# Patient Record
Sex: Male | Born: 1950 | Race: Black or African American | Hispanic: No | Marital: Married | Smoking: Never smoker
Health system: Southern US, Community
[De-identification: ages and names within clinical notes are randomized; demographics above are authoritative.]

## PROBLEM LIST (undated history)

## (undated) DIAGNOSIS — C801 Malignant (primary) neoplasm, unspecified: Secondary | ICD-10-CM

## (undated) HISTORY — PX: EYE SURGERY: SHX253

---

## 2018-09-19 ENCOUNTER — Ambulatory Visit: Payer: Self-pay | Admitting: Family Medicine

## 2018-09-21 ENCOUNTER — Encounter (HOSPITAL_COMMUNITY): Payer: Self-pay

## 2018-09-21 ENCOUNTER — Other Ambulatory Visit: Payer: Self-pay

## 2018-09-21 ENCOUNTER — Ambulatory Visit (HOSPITAL_COMMUNITY)
Admission: EM | Admit: 2018-09-21 | Discharge: 2018-09-21 | Disposition: A | Discharge status: Home / Self Care | Payer: Medicare HMO | Attending: Family Medicine | Admitting: Family Medicine

## 2018-09-21 DIAGNOSIS — G43019 Migraine without aura, intractable, without status migrainosus: Secondary | ICD-10-CM

## 2018-09-21 MED ORDER — DEXAMETHASONE SODIUM PHOSPHATE 10 MG/ML IJ SOLN
INTRAMUSCULAR | Status: AC
  Filled 2018-09-21: qty 1

## 2018-09-21 MED ORDER — DEXAMETHASONE SODIUM PHOSPHATE 10 MG/ML IJ SOLN
10.0000 mg | Freq: Once | INTRAMUSCULAR | Status: AC
  Administered 2018-09-21: 10 mg via INTRAMUSCULAR

## 2018-09-21 MED ORDER — IBUPROFEN 800 MG PO TABS: 800.0000 mg | ORAL_TABLET | Freq: Three times a day (TID) | ORAL | 0 refills | Status: DC

## 2018-09-21 MED ORDER — KETOROLAC TROMETHAMINE 60 MG/2ML IM SOLN
INTRAMUSCULAR | Status: AC
  Filled 2018-09-21: qty 2

## 2018-09-21 MED ORDER — KETOROLAC TROMETHAMINE 60 MG/2ML IM SOLN
60.0000 mg | Freq: Once | INTRAMUSCULAR | Status: AC
  Administered 2018-09-21: 60 mg via INTRAMUSCULAR

## 2018-09-21 NOTE — ED Triage Notes (Signed)
Pt present abdominal and Headaches. Symptoms has been going on for a few weeks. Pt has tried  OTC medication with no relief.

## 2018-09-21 NOTE — ED Provider Notes (Signed)
[place]    CSN: [id number] Arrival date & time: 09/21/18  1818     History   Chief Complaint Chief Complaint  Patient presents with  . Abdominal Pain  . Migraine    HPI Steven Jensen is a 68 y.o. male.   HPI  Patient is here for migraine.  He states he is had migraines for most of his life.  He states that he usually takes ibuprofen at home.  On one other occasion he went to the hospital for migraine and got a shot.  He states he currently has had a headache for 4 days.  Ibuprofen is not helping.  He is here with his fiance.  She states he has been lying in the bed for the last 3 to 4 days.  He has photophobia.  Nausea but no vomiting.  No head injury.  No recent infection.  This does feel like a typical migraine for him. At triage she said he is having some abdominal problems.  He denies this to me.  He does want to be seen for abdominal problems.  He is not having any nausea or vomiting.  States his bowels are regular. He just moved here from [place].  I have no old records.  He states he is healthy and on no medications, does not smoke or drink.  He has an appointment with [facility] to set up primary care in the upcoming weeks.  History reviewed. No pertinent past medical history.  There are no active problems to display for this patient.   Past Surgical History:  Procedure Laterality Date  . EYE SURGERY         Home Medications    Prior to Admission medications   Medication Sig Start Date End Date Taking? Authorizing Provider  ibuprofen (ADVIL,MOTRIN) 800 MG tablet Take 1 tablet (800 mg total) by mouth 3 (three) times daily. 09/21/18   Raylene Everts, MD    Family History Family History  Family history unknown: Yes    Social History Social History   Tobacco Use  . Smoking status: Never Smoker  . Smokeless tobacco: Never Used  Substance Use Topics  . Alcohol use: Never    Frequency: Never  . Drug use: Never     Allergies    Patient has no known allergies.   Review of Systems Review of Systems  Constitutional: Negative for chills and fever.  HENT: Negative for ear pain and sore throat.   Eyes: Positive for photophobia. Negative for pain and visual disturbance.  Respiratory: Negative for cough and shortness of breath.   Cardiovascular: Negative for chest pain and palpitations.  Gastrointestinal: Negative for abdominal pain and vomiting.  Genitourinary: Negative for dysuria and hematuria.  Musculoskeletal: Negative for arthralgias and back pain.  Skin: Negative for color change and rash.  Neurological: Positive for headaches. Negative for seizures and syncope.  All other systems reviewed and are negative.    Physical Exam Triage Vital Signs ED Triage Vitals  Enc Vitals Group     BP 09/21/18 1903 139/90     Pulse Rate 09/21/18 1903 91     Resp 09/21/18 1903 16     Temp 09/21/18 1903 97.8 F (36.6 C)     Temp Source 09/21/18 1903 Oral     SpO2 09/21/18 1903 100 %     Weight --      Height --      Head Circumference --      Peak Flow --  Pain Score 09/21/18 1904 7     Pain Loc --      Pain Edu? --      Excl. in [place]? --    No data found.  Updated Vital Signs BP 139/90 (BP Location: Left Arm)   Pulse 91   Temp 97.8 F (36.6 C) (Oral)   Resp 16   SpO2 100%      Physical Exam Constitutional:      General: He is not in acute distress.    Appearance: He is well-developed.  HENT:     Head: Normocephalic and atraumatic.     Mouth/Throat:     Comments: eDentulous upper Eyes:     Conjunctiva/sclera: Conjunctivae normal.     Pupils: Pupils are equal, round, and reactive to light.     Comments: Cataracts  Neck:     Musculoskeletal: Normal range of motion.  Cardiovascular:     Rate and Rhythm: Normal rate and regular rhythm.     Heart sounds: Normal heart sounds.  Pulmonary:     Effort: Pulmonary effort is normal. No respiratory distress.     Breath sounds: Normal breath sounds.   Abdominal:     General: Bowel sounds are normal. There is no distension.     Palpations: Abdomen is soft.     Tenderness: There is no abdominal tenderness.  Musculoskeletal: Normal range of motion.  Skin:    General: Skin is warm and dry.  Neurological:     General: No focal deficit present.     Mental Status: He is alert.  Psychiatric:        Mood and Affect: Mood normal.        Behavior: Behavior normal.      UC Treatments / Results  Labs (all labs ordered are listed, but only abnormal results are displayed) Labs Reviewed - No data to display  EKG None  Radiology No results found.  Procedures Procedures (including critical care time)  Medications Ordered in UC Medications  ketorolac (TORADOL) injection 60 mg (60 mg Intramuscular Given 09/21/18 1937)  dexamethasone (DECADRON) injection 10 mg (10 mg Intramuscular Given 09/21/18 1938)    Initial Impression / Assessment and Plan / UC Course  I have reviewed the triage vital signs and the nursing notes.  Pertinent labs & imaging results that were available during my care of the patient were reviewed by me and considered in my medical decision making (see chart for details).     I believe this is a typical migraine.  I am giving him a shot of Toradol.Return if worse instead of better at any time. Final Clinical Impressions(s) / UC Diagnoses   Final diagnoses:  Intractable migraine without aura and without status migrainosus     Discharge Instructions     Giving you a shot of Toradol for your migraine headache.  Also Decadron. I am giving you a prescription of ibuprofen 800 mg to take 3 times a day with food.  This is for headache You need to follow-up with [facility] to establish with a primary care Return as needed   ED Prescriptions    Medication Sig Dispense Auth. Provider   ibuprofen (ADVIL,MOTRIN) 800 MG tablet Take 1 tablet (800 mg total) by mouth 3 (three) times daily. 21 tablet Raylene Everts, MD     Controlled Substance Prescriptions [place] Controlled Substance Registry consulted? No   Raylene Everts, MD 09/21/18 2055

## 2018-09-21 NOTE — Discharge Instructions (Addendum)
Giving you a shot of Toradol for your migraine headache.  Also Decadron. I am giving you a prescription of ibuprofen 800 mg to take 3 times a day with food.  This is for headache You need to follow-up with [facility] to establish with a primary care Return as needed

## 2018-10-12 ENCOUNTER — Ambulatory Visit: Payer: Self-pay | Admitting: Family Medicine

## 2019-01-18 ENCOUNTER — Ambulatory Visit: Payer: Medicare HMO | Admitting: Family Medicine

## 2019-02-05 ENCOUNTER — Ambulatory Visit: Payer: Medicare HMO | Admitting: Family Medicine

## 2019-03-06 ENCOUNTER — Ambulatory Visit: Payer: Medicare HMO | Admitting: Family Medicine

## 2019-04-03 ENCOUNTER — Other Ambulatory Visit: Payer: Self-pay

## 2019-04-03 ENCOUNTER — Encounter (HOSPITAL_COMMUNITY): Payer: Self-pay | Admitting: Emergency Medicine

## 2019-04-03 ENCOUNTER — Ambulatory Visit (HOSPITAL_COMMUNITY)
Admission: EM | Admit: 2019-04-03 | Discharge: 2019-04-03 | Disposition: A | Discharge status: Home / Self Care | Payer: Medicare HMO | Attending: Family Medicine | Admitting: Family Medicine

## 2019-04-03 DIAGNOSIS — G43009 Migraine without aura, not intractable, without status migrainosus: Secondary | ICD-10-CM

## 2019-04-03 MED ORDER — KETOROLAC TROMETHAMINE 60 MG/2ML IM SOLN
INTRAMUSCULAR | Status: AC
  Filled 2019-04-03: qty 2

## 2019-04-03 MED ORDER — KETOROLAC TROMETHAMINE 60 MG/2ML IM SOLN
60.0000 mg | Freq: Once | INTRAMUSCULAR | Status: AC
  Administered 2019-04-03: 60 mg via INTRAMUSCULAR

## 2019-04-03 MED ORDER — DEXAMETHASONE SODIUM PHOSPHATE 10 MG/ML IJ SOLN
INTRAMUSCULAR | Status: AC
  Filled 2019-04-03: qty 1

## 2019-04-03 MED ORDER — IBUPROFEN 800 MG PO TABS: 800.0000 mg | ORAL_TABLET | Freq: Three times a day (TID) | ORAL | 0 refills | Status: DC

## 2019-04-03 MED ORDER — DEXAMETHASONE SODIUM PHOSPHATE 10 MG/ML IJ SOLN
10.0000 mg | Freq: Once | INTRAMUSCULAR | Status: AC
  Administered 2019-04-03: 10 mg via INTRAMUSCULAR

## 2019-04-03 NOTE — ED Provider Notes (Signed)
[name]    CSN: [id number] Arrival date & time: 04/03/19  1615      History   Chief Complaint Chief Complaint  Patient presents with  . Headache    HPI Steven Jensen is a 68 y.o. male no significant past medical history presenting today for evaluation of headache.  Patient states that this morning around 2 AM he woke up and had a throbbing headache to his right side.  This is waxed and waned throughout the day.  He has taken ibuprofen and Aleve.  Denies associated vision changes.  Denies nausea or vomiting.  Denies photophobia.  Feels similar to previous headaches.  In the past has done well with injections of Toradol and Decadron.  Feels similar to past.  Denies any fevers.  Denies difficulty speaking, lightheadedness, dizziness.  Denies any weakness.  Denies history of hypertension, diabetes.  Is a previous smoker, quit approximately 1 to 2 years ago.  HPI  History reviewed. No pertinent past medical history.  There are no active problems to display for this patient.   Past Surgical History:  Procedure Laterality Date  . EYE SURGERY         Home Medications    Prior to Admission medications   Medication Sig Start Date End Date Taking? Authorizing Provider  ibuprofen (ADVIL) 800 MG tablet Take 1 tablet (800 mg total) by mouth 3 (three) times daily. 04/03/19   Talecia Sherlin, Elesa Hacker, PA-C    Family History Family History  Family history unknown: Yes    Social History Social History   Tobacco Use  . Smoking status: Never Smoker  . Smokeless tobacco: Never Used  Substance Use Topics  . Alcohol use: Never    Frequency: Never  . Drug use: Never     Allergies   Patient has no known allergies.   Review of Systems Review of Systems  Constitutional: Negative for fatigue and fever.  HENT: Negative for congestion, sinus pressure and sore throat.   Eyes: Negative for photophobia, pain and visual disturbance.  Respiratory: Negative for cough and  shortness of breath.   Cardiovascular: Negative for chest pain.  Gastrointestinal: Negative for abdominal pain, nausea and vomiting.  Genitourinary: Negative for decreased urine volume and hematuria.  Musculoskeletal: Negative for myalgias, neck pain and neck stiffness.  Neurological: Positive for headaches. Negative for dizziness, syncope, facial asymmetry, speech difficulty, weakness, light-headedness and numbness.     Physical Exam Triage Vital Signs ED Triage Vitals  Enc Vitals Group     BP --      Pulse Rate 04/03/19 1714 74     Resp 04/03/19 1714 20     Temp 04/03/19 1714 98.6 F (37 C)     Temp Source 04/03/19 1714 Oral     SpO2 04/03/19 1714 100 %     Weight --      Height --      Head Circumference --      Peak Flow --      Pain Score 04/03/19 1712 5     Pain Loc --      Pain Edu? --      Excl. in [place]? --    No data found.  Updated Vital Signs Pulse 74   Temp 98.6 F (37 C) (Oral)   Resp 20   SpO2 100%   Visual Acuity Right Eye Distance:   Left Eye Distance:   Bilateral Distance:    Right Eye Near:   Left Eye Near:  Bilateral Near:     Physical Exam Vitals signs and nursing note reviewed.  Constitutional:      Appearance: He is well-developed.  HENT:     Head: Normocephalic and atraumatic.     Ears:     Comments: Bilateral ears without tenderness to palpation of external auricle, tragus and mastoid, EAC's without erythema or swelling, TM's with good bony landmarks and cone of light. Non erythematous.     Mouth/Throat:     Comments: Oral mucosa pink and moist, no tonsillar enlargement or exudate. Posterior pharynx patent and nonerythematous, no uvula deviation or swelling. Normal phonation. Palate elevates symmetrically Eyes:     Extraocular Movements: Extraocular movements intact.     Conjunctiva/sclera: Conjunctivae normal.     Pupils: Pupils are equal, round, and reactive to light.  Neck:     Musculoskeletal: Neck supple.  Cardiovascular:      Rate and Rhythm: Normal rate and regular rhythm.     Heart sounds: No murmur.  Pulmonary:     Effort: Pulmonary effort is normal. No respiratory distress.     Breath sounds: Normal breath sounds.     Comments: Breathing comfortably at rest, CTABL, no wheezing, rales or other adventitious sounds auscultated Abdominal:     Palpations: Abdomen is soft.     Tenderness: There is no abdominal tenderness.  Skin:    General: Skin is warm and dry.  Neurological:     General: No focal deficit present.     Mental Status: He is alert and oriented to person, place, and time. Mental status is at baseline.     Comments: Patient A&O x3, cranial nerves II-XII grossly intact, strength at shoulders, hips and knees 5/5, equal bilaterally, patellar reflex 2+ bilaterally. Negative Romberg. Gait without abnormality.       UC Treatments / Results  Labs (all labs ordered are listed, but only abnormal results are displayed) Labs Reviewed - No data to display  EKG   Radiology No results found.  Procedures Procedures (including critical care time)  Medications Ordered in UC Medications  ketorolac (TORADOL) injection 60 mg (60 mg Intramuscular Given 04/03/19 1804)  dexamethasone (DECADRON) injection 10 mg (10 mg Intramuscular Given 04/03/19 1803)  dexamethasone (DECADRON) 10 MG/ML injection (has no administration in time range)  ketorolac (TORADOL) 60 MG/2ML injection (has no administration in time range)    Initial Impression / Assessment and Plan / UC Course  I have reviewed the triage vital signs and the nursing notes.  Pertinent labs & imaging results that were available during my care of the patient were reviewed by me and considered in my medical decision making (see chart for details).     No neuro deficits on exam, no acute distress.  Do not suspect underlying acute intracranial abnormality at this time that would warrant CT imaging.  Will provide Toradol and Decadron.  Will refill  ibuprofen 800 as he has had relief with this as well.  Discussed warning signs,Discussed strict return precautions. Patient verbalized understanding and is agreeable with plan.  Final Clinical Impressions(s) / UC Diagnoses   Final diagnoses:  Migraine without aura and without status migrainosus, not intractable     Discharge Instructions     We gave you Toradol and Decadron today for your headache Continue with ibuprofen at home for further headache management  Please follow-up in the emergency room if developing worsening headache, vision changes, difficulty speaking, confusion, lightheadedness, dizziness, facial drooping, one-sided weakness   ED Prescriptions  Medication Sig Dispense Auth. Provider   ibuprofen (ADVIL) 800 MG tablet Take 1 tablet (800 mg total) by mouth 3 (three) times daily. 30 tablet Giovoni Bunch, [place] C, PA-C     PDMP not reviewed this encounter.   Janith Lima, [place] 04/03/19 1827

## 2019-04-03 NOTE — ED Triage Notes (Signed)
Patient presents to [facility] for assessment of headache starting this morning, has tried aleve and ibuprofen and had relief for a brief period, but headache is back.  Denies vision changed, denies numbness or weakness.

## 2019-04-03 NOTE — Discharge Instructions (Signed)
We gave you Toradol and Decadron today for your headache Continue with ibuprofen at home for further headache management  Please follow-up in the emergency room if developing worsening headache, vision changes, difficulty speaking, confusion, lightheadedness, dizziness, facial drooping, one-sided weakness

## 2019-05-14 ENCOUNTER — Ambulatory Visit: Payer: Medicare HMO | Admitting: Family Medicine

## 2019-07-06 ENCOUNTER — Encounter (HOSPITAL_COMMUNITY): Payer: Self-pay

## 2019-07-06 ENCOUNTER — Ambulatory Visit (HOSPITAL_COMMUNITY)
Admission: EM | Admit: 2019-07-06 | Discharge: 2019-07-06 | Disposition: A | Discharge status: Home / Self Care | Payer: Medicare HMO | Attending: Family Medicine | Admitting: Family Medicine

## 2019-07-06 ENCOUNTER — Other Ambulatory Visit: Payer: Self-pay

## 2019-07-06 DIAGNOSIS — M5432 Sciatica, left side: Secondary | ICD-10-CM | POA: Diagnosis not present

## 2019-07-06 MED ORDER — KETOROLAC TROMETHAMINE 30 MG/ML IJ SOLN
15.0000 mg | Freq: Once | INTRAMUSCULAR | Status: AC
  Administered 2019-07-06: 13:00:00 15 mg via INTRAMUSCULAR

## 2019-07-06 MED ORDER — KETOROLAC TROMETHAMINE 30 MG/ML IJ SOLN
INTRAMUSCULAR | Status: AC
  Filled 2019-07-06: qty 1

## 2019-07-06 MED ORDER — PREDNISONE 10 MG (21) PO TBPK: ORAL_TABLET | ORAL | 0 refills | Status: DC

## 2019-07-06 NOTE — Discharge Instructions (Addendum)
Take the medication as prescribed with food Follow up as needed for continued or worsening symptoms

## 2019-07-06 NOTE — ED Triage Notes (Signed)
_Pt presents with recurrent left side sciatic nerve pain that starts at lower back and radiates down left leg since yesterday.

## 2019-07-07 NOTE — ED Provider Notes (Signed)
[place]    CSN: [id number] Arrival date & time: 07/06/19  1053      History   Chief Complaint Chief Complaint  Patient presents with  . Sciatic Pain    HPI Steven Jensen is a 68 y.o. male.   Patient is a 68 year old male presents today with recurrent left-sided sciatic nerve pain.  This episode started yesterday.  Reporting pain starts left lower back radiating down to the left leg with some generalized tingling.  Worse with laying flat.  Symptoms have been waxing waning.  He took ibuprofen without much relief.  Denies any injuries or heavy lifting.  Denies any loss of bowel or bladder control or saddle paresthesias.  No fevers, urinary symptoms or constipation.  ROS per HPI      History reviewed. No pertinent past medical history.  There are no problems to display for this patient.   Past Surgical History:  Procedure Laterality Date  . EYE SURGERY         Home Medications    Prior to Admission medications   Medication Sig Start Date End Date Taking? Authorizing Provider  ibuprofen (ADVIL) 800 MG tablet Take 1 tablet (800 mg total) by mouth 3 (three) times daily. 04/03/19   Wieters, Hallie C, PA-C  predniSONE (STERAPRED UNI-PAK 21 TAB) 10 MG (21) TBPK tablet 6 tabs for 1 day, then 5 tabs for 1 das, then 4 tabs for 1 day, then 3 tabs for 1 day, 2 tabs for 1 day, then 1 tab for 1 day 07/06/19   Orvan July, NP    Family History Family History  Family history unknown: Yes    Social History Social History   Tobacco Use  . Smoking status: Never Smoker  . Smokeless tobacco: Never Used  Substance Use Topics  . Alcohol use: Never  . Drug use: Never     Allergies   Patient has no known allergies.   Review of Systems Review of Systems   Physical Exam Triage Vital Signs ED Triage Vitals  Enc Vitals Group     BP 07/06/19 1209 (!) 142/97     Pulse Rate 07/06/19 1209 68     Resp 07/06/19 1209 17     Temp 07/06/19 1209 98.3 F (36.8  C)     Temp Source 07/06/19 1209 Oral     SpO2 07/06/19 1209 98 %     Weight --      Height --      Head Circumference --      Peak Flow --      Pain Score 07/06/19 1210 10     Pain Loc --      Pain Edu? --      Excl. in [place]? --    No data found.  Updated Vital Signs BP (!) 142/97 (BP Location: Right Arm)   Pulse 68   Temp 98.3 F (36.8 C) (Oral)   Resp 17   SpO2 98%   Visual Acuity Right Eye Distance:   Left Eye Distance:   Bilateral Distance:    Right Eye Near:   Left Eye Near:    Bilateral Near:     Physical Exam Vitals and nursing note reviewed.  Constitutional:      Appearance: Normal appearance.  HENT:     Head: Normocephalic and atraumatic.     Nose: Nose normal.  Eyes:     Conjunctiva/sclera: Conjunctivae normal.  Pulmonary:     Effort: Pulmonary effort is normal.  Musculoskeletal:        General: Normal range of motion.     Cervical back: Normal range of motion.     Lumbar back: Tenderness present.       Back:  Skin:    General: Skin is warm and dry.  Neurological:     Mental Status: He is alert.  Psychiatric:        Mood and Affect: Mood normal.      UC Treatments / Results  Labs (all labs ordered are listed, but only abnormal results are displayed) Labs Reviewed - No data to display  EKG   Radiology No results found.  Procedures Procedures (including critical care time)  Medications Ordered in UC Medications  ketorolac (TORADOL) 30 MG/ML injection 15 mg (15 mg Intramuscular Given 07/06/19 1234)    Initial Impression / Assessment and Plan / UC Course  I have reviewed the triage vital signs and the nursing notes.  Pertinent labs & imaging results that were available during my care of the patient were reviewed by me and considered in my medical decision making (see chart for details).     Left-sided nerve pain-treating with small dose of Toradol injection here in clinic and prednisone taper over the next 6 days. Follow up as  needed for continued or worsening symptoms  Final Clinical Impressions(s) / UC Diagnoses   Final diagnoses:  Left sciatic nerve pain     Discharge Instructions     Take the medication as prescribed with food Follow up as needed for continued or worsening symptoms     ED Prescriptions    Medication Sig Dispense Auth. Provider   predniSONE (STERAPRED UNI-PAK 21 TAB) 10 MG (21) TBPK tablet 6 tabs for 1 day, then 5 tabs for 1 das, then 4 tabs for 1 day, then 3 tabs for 1 day, 2 tabs for 1 day, then 1 tab for 1 day 21 tablet Jan Walters A, NP     PDMP not reviewed this encounter.   Orvan July, NP 07/07/19 1448

## 2019-07-08 ENCOUNTER — Ambulatory Visit (HOSPITAL_COMMUNITY)
Admission: EM | Admit: 2019-07-08 | Discharge: 2019-07-08 | Disposition: A | Discharge status: Home / Self Care | Payer: Medicare HMO | Attending: Family Medicine | Admitting: Family Medicine

## 2019-07-08 ENCOUNTER — Other Ambulatory Visit: Payer: Self-pay

## 2019-07-08 ENCOUNTER — Ambulatory Visit (INDEPENDENT_AMBULATORY_CARE_PROVIDER_SITE_OTHER): Payer: Medicare HMO

## 2019-07-08 ENCOUNTER — Encounter (HOSPITAL_COMMUNITY): Payer: Self-pay

## 2019-07-08 DIAGNOSIS — M5136 Other intervertebral disc degeneration, lumbar region: Secondary | ICD-10-CM

## 2019-07-08 DIAGNOSIS — M545 Low back pain: Secondary | ICD-10-CM | POA: Diagnosis not present

## 2019-07-08 MED ORDER — TRAMADOL HCL 50 MG PO TABS: 50.0000 mg | ORAL_TABLET | Freq: Two times a day (BID) | ORAL | 0 refills | Status: DC | PRN

## 2019-07-08 NOTE — ED Triage Notes (Signed)
Pt states he has sciatic pain. Pt states he was here for this issue on Saturday. Pt states he needs a work note. Pt states the meds are not working.

## 2019-07-08 NOTE — Discharge Instructions (Signed)
Your x-ray did show some degenerative disc disease.  This is most likely the cause of your symptoms Patient to keep taking the prednisone until the prescription is finished I will give you some tramadol to help with severe pain and a work note. If your symptoms continue you will need to follow-up with an orthopedic.

## 2019-07-09 NOTE — ED Provider Notes (Signed)
[place]    CSN: [id number] Arrival date & time: 07/08/19  [id number]      History   Chief Complaint Chief Complaint  Patient presents with  . Back Pain    HPI Steven Jensen is a 68 y.o. male.   Patient is a 68 year old male the presents today for continued left sciatic nerve pain.  He has been taking the prednisone as prescribed without any relief.  Reports he has been resting.  Denies any worsening symptoms to include saddle paresthesias, loss of bowel or bladder control.  Denies any fevers.  ROS per HPI    Back Pain   History reviewed. No pertinent past medical history.  There are no problems to display for this patient.   Past Surgical History:  Procedure Laterality Date  . EYE SURGERY         Home Medications    Prior to Admission medications   Medication Sig Start Date End Date Taking? Authorizing Provider  ibuprofen (ADVIL) 800 MG tablet Take 1 tablet (800 mg total) by mouth 3 (three) times daily. 04/03/19   Wieters, Hallie C, PA-C  predniSONE (STERAPRED UNI-PAK 21 TAB) 10 MG (21) TBPK tablet 6 tabs for 1 day, then 5 tabs for 1 das, then 4 tabs for 1 day, then 3 tabs for 1 day, 2 tabs for 1 day, then 1 tab for 1 day 07/06/19   Loura Halt A, NP  traMADol (ULTRAM) 50 MG tablet Take 1 tablet (50 mg total) by mouth every 12 (twelve) hours as needed. 07/08/19   Orvan July, NP    Family History Family History  Family history unknown: Yes    Social History Social History   Tobacco Use  . Smoking status: Never Smoker  . Smokeless tobacco: Never Used  Substance Use Topics  . Alcohol use: Never  . Drug use: Never     Allergies   Patient has no known allergies.   Review of Systems Review of Systems  Musculoskeletal: Positive for back pain.     Physical Exam Triage Vital Signs ED Triage Vitals  Enc Vitals Group     BP 07/08/19 1132 (!) 163/84     Pulse Rate 07/08/19 1132 82     Resp 07/08/19 1132 16     Temp 07/08/19 1132  98.5 F (36.9 C)     Temp Source 07/08/19 1132 Oral     SpO2 07/08/19 1132 100 %     Weight 07/08/19 1130 149 lb (67.6 kg)     Height --      Head Circumference --      Peak Flow --      Pain Score 07/08/19 1321 2     Pain Loc --      Pain Edu? --      Excl. in [place]? --    No data found.  Updated Vital Signs BP (!) 163/84 (BP Location: Right Arm)   Pulse 82   Temp 98.5 F (36.9 C) (Oral)   Resp 16   Wt 149 lb (67.6 kg)   SpO2 100%   Visual Acuity Right Eye Distance:   Left Eye Distance:   Bilateral Distance:    Right Eye Near:   Left Eye Near:    Bilateral Near:     Physical Exam Vitals and nursing note reviewed.  Constitutional:      Appearance: Normal appearance.  HENT:     Head: Normocephalic and atraumatic.     Nose: Nose normal.  Eyes:     Conjunctiva/sclera: Conjunctivae normal.  Pulmonary:     Effort: Pulmonary effort is normal.  Musculoskeletal:     Cervical back: Normal range of motion.     Lumbar back: Decreased range of motion. Positive left straight leg raise test.       Back:  Skin:    General: Skin is warm and dry.  Neurological:     Mental Status: He is alert.  Psychiatric:        Mood and Affect: Mood normal.      UC Treatments / Results  Labs (all labs ordered are listed, but only abnormal results are displayed) Labs Reviewed - No data to display  EKG   Radiology DG Lumbar Spine Complete  Result Date: 07/08/2019 CLINICAL DATA:  Low back pain radiating into the left leg, initial encounter EXAM: LUMBAR SPINE - COMPLETE 4+ VIEW COMPARISON:  None. FINDINGS: Five lumbar type vertebral bodies are well visualized. Vertebral body height is well maintained. No pars defects are seen. Mild facet hypertrophic changes are noted. Disc space narrowing at L4-5 and L5-S1 is noted. Mild degenerative anterolisthesis of L4 on L5 is seen. No soft tissue abnormality is noted. IMPRESSION: Degenerative changes as described. Electronically Signed   By:  Inez Catalina M.D.   On: 07/08/2019 13:09    Procedures Procedures (including critical care time)  Medications Ordered in UC Medications - No data to display  Initial Impression / Assessment and Plan / UC Course  I have reviewed the triage vital signs and the nursing notes.  Pertinent labs & imaging results that were available during my care of the patient were reviewed by me and considered in my medical decision making (see chart for details).     Degenerative disc disease lumbar-based on patient's persistent symptoms despite prednisone taper, decided to do lower lumbar x-ray which revealed degenerative disc disease at L4-5 and L5-S1.  This did explain the cause for his symptoms. We will have him continue the prednisone taper and give tramadol for more severe pain Recommended if symptoms continue or worsen he will need to see orthopedic Patient understanding and agree. Final Clinical Impressions(s) / UC Diagnoses   Final diagnoses:  Degenerative disc disease, lumbar     Discharge Instructions     Your x-ray did show some degenerative disc disease.  This is most likely the cause of your symptoms Patient to keep taking the prednisone until the prescription is finished I will give you some tramadol to help with severe pain and a work note. If your symptoms continue you will need to follow-up with an orthopedic.    ED Prescriptions    Medication Sig Dispense Auth. Provider   traMADol (ULTRAM) 50 MG tablet Take 1 tablet (50 mg total) by mouth every 12 (twelve) hours as needed. 15 tablet Keelen Quevedo A, NP     I have reviewed the PDMP during this encounter.   Orvan July, NP 07/09/19 1629

## 2019-07-15 ENCOUNTER — Ambulatory Visit: Payer: Medicare HMO | Admitting: Family Medicine

## 2019-07-17 ENCOUNTER — Ambulatory Visit: Payer: Medicare HMO | Admitting: Family Medicine

## 2019-07-23 DIAGNOSIS — M5416 Radiculopathy, lumbar region: Secondary | ICD-10-CM | POA: Diagnosis not present

## 2019-08-07 ENCOUNTER — Other Ambulatory Visit: Payer: Self-pay | Admitting: Sports Medicine

## 2019-08-07 DIAGNOSIS — M545 Low back pain, unspecified: Secondary | ICD-10-CM

## 2019-08-09 ENCOUNTER — Other Ambulatory Visit: Payer: Medicare HMO

## 2019-08-09 DIAGNOSIS — M5416 Radiculopathy, lumbar region: Secondary | ICD-10-CM | POA: Diagnosis not present

## 2019-08-14 DIAGNOSIS — S335XXA Sprain of ligaments of lumbar spine, initial encounter: Secondary | ICD-10-CM | POA: Diagnosis not present

## 2019-08-17 DIAGNOSIS — M545 Low back pain: Secondary | ICD-10-CM | POA: Diagnosis not present

## 2019-08-20 DIAGNOSIS — M545 Low back pain: Secondary | ICD-10-CM | POA: Diagnosis not present

## 2019-09-09 ENCOUNTER — Ambulatory Visit: Payer: Medicare HMO | Admitting: Family Medicine

## 2019-09-09 DIAGNOSIS — Z0289 Encounter for other administrative examinations: Secondary | ICD-10-CM

## 2019-10-08 DIAGNOSIS — Z23 Encounter for immunization: Secondary | ICD-10-CM | POA: Diagnosis not present

## 2019-11-05 DIAGNOSIS — Z23 Encounter for immunization: Secondary | ICD-10-CM | POA: Diagnosis not present

## 2020-02-10 DIAGNOSIS — H2513 Age-related nuclear cataract, bilateral: Secondary | ICD-10-CM | POA: Diagnosis not present

## 2020-02-10 DIAGNOSIS — H109 Unspecified conjunctivitis: Secondary | ICD-10-CM | POA: Diagnosis not present

## 2020-04-06 DIAGNOSIS — H2513 Age-related nuclear cataract, bilateral: Secondary | ICD-10-CM | POA: Diagnosis not present

## 2020-04-06 DIAGNOSIS — H33002 Unspecified retinal detachment with retinal break, left eye: Secondary | ICD-10-CM | POA: Diagnosis not present

## 2020-04-10 DEATH — deceased

## 2020-04-12 DIAGNOSIS — M546 Pain in thoracic spine: Secondary | ICD-10-CM | POA: Diagnosis not present

## 2020-04-12 DIAGNOSIS — Z9842 Cataract extraction status, left eye: Secondary | ICD-10-CM | POA: Diagnosis not present

## 2020-04-12 DIAGNOSIS — M62830 Muscle spasm of back: Secondary | ICD-10-CM | POA: Diagnosis not present

## 2020-07-21 DIAGNOSIS — H2513 Age-related nuclear cataract, bilateral: Secondary | ICD-10-CM | POA: Diagnosis not present

## 2020-07-21 DIAGNOSIS — Z0100 Encounter for examination of eyes and vision without abnormal findings: Secondary | ICD-10-CM | POA: Diagnosis not present

## 2021-08-13 ENCOUNTER — Encounter: Payer: Self-pay | Admitting: Family Medicine

## 2021-08-13 ENCOUNTER — Ambulatory Visit (INDEPENDENT_AMBULATORY_CARE_PROVIDER_SITE_OTHER): Payer: Medicare HMO | Admitting: Family Medicine

## 2021-08-13 ENCOUNTER — Other Ambulatory Visit: Payer: Self-pay

## 2021-08-13 VITALS — BP 120/74 | HR 87 | Temp 97.3°F | Ht 68.0 in | Wt 142.8 lb

## 2021-08-13 DIAGNOSIS — Z1211 Encounter for screening for malignant neoplasm of colon: Secondary | ICD-10-CM | POA: Diagnosis not present

## 2021-08-13 DIAGNOSIS — R69 Illness, unspecified: Secondary | ICD-10-CM | POA: Diagnosis not present

## 2021-08-13 DIAGNOSIS — R972 Elevated prostate specific antigen [PSA]: Secondary | ICD-10-CM

## 2021-08-13 DIAGNOSIS — K2960 Other gastritis without bleeding: Secondary | ICD-10-CM | POA: Diagnosis not present

## 2021-08-13 DIAGNOSIS — G2581 Restless legs syndrome: Secondary | ICD-10-CM | POA: Diagnosis not present

## 2021-08-13 DIAGNOSIS — R350 Frequency of micturition: Secondary | ICD-10-CM | POA: Diagnosis not present

## 2021-08-13 DIAGNOSIS — T39395A Adverse effect of other nonsteroidal anti-inflammatory drugs [NSAID], initial encounter: Secondary | ICD-10-CM

## 2021-08-13 DIAGNOSIS — Z1159 Encounter for screening for other viral diseases: Secondary | ICD-10-CM | POA: Diagnosis not present

## 2021-08-13 DIAGNOSIS — E782 Mixed hyperlipidemia: Secondary | ICD-10-CM | POA: Diagnosis not present

## 2021-08-13 DIAGNOSIS — G4730 Sleep apnea, unspecified: Secondary | ICD-10-CM

## 2021-08-13 DIAGNOSIS — Z114 Encounter for screening for human immunodeficiency virus [HIV]: Secondary | ICD-10-CM

## 2021-08-13 DIAGNOSIS — R7303 Prediabetes: Secondary | ICD-10-CM | POA: Diagnosis not present

## 2021-08-13 DIAGNOSIS — Z0000 Encounter for general adult medical examination without abnormal findings: Secondary | ICD-10-CM

## 2021-08-13 DIAGNOSIS — N401 Enlarged prostate with lower urinary tract symptoms: Secondary | ICD-10-CM

## 2021-08-13 MED ORDER — TAMSULOSIN HCL 0.4 MG PO CAPS: 0.4000 mg | ORAL_CAPSULE | Freq: Every day | ORAL | 3 refills | Status: DC

## 2021-08-13 MED ORDER — PANTOPRAZOLE SODIUM 40 MG PO TBEC: 40.0000 mg | DELAYED_RELEASE_TABLET | Freq: Every day | ORAL | 3 refills | Status: DC

## 2021-08-13 NOTE — Progress Notes (Addendum)
Established Patient Office Visit  Subjective:  Patient ID: Steven Jensen, male    DOB: [birth year]  Age: 71 y.o. MRN: [id number]  CC:  Chief Complaint  Patient presents with   Establish Care    NP/establish care, no concerns. Patient not fasting.     HPI [name] presents for establishment of care he is accompanied by his wife.  They have recently moved into the area.  He has been lost to follow-up from regular medical care for quite some time.  He is healthy as far as he knows.  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  He has been taking a lot of ibuprofen and Aleve for body aches and pains.  He does admit to some increased belching and flatulence.  He does not appreciate any reflux or brackish taste in the morning.  He does have morning nausea and vomiting on occasion.  He has not seen any blood in his stool or melena.  He has never had a colonoscopy.  Over the last month or so his urine frequency has increased a great deal.  He is getting up every few hours to urinate.  MRI from a year or 2 ago showed a suspicious alleviating lesion somewhere in his body.  He thinks it may have been the prostate.  Wife says that he snores loudly and stops breathing.  She also says that he kicks her at night in his sleep.  He has lost 2 to 3 pounds because he wanted to.  Denies night sweats.  He feels healthy for the most part.  Feels as though he is 8 on a scale of 10.  History reviewed. No pertinent past medical history.  Past Surgical History:  Procedure Laterality Date   EYE SURGERY      Family History  Family history unknown: Yes    Social History   Socioeconomic History   Marital status: Legally Separated    Spouse name: Not on file   Number of children: Not on file   Years of education: Not on file   Highest education level: Not on file  Occupational History   Not on file  Tobacco Use   Smoking status:  Never   Smokeless tobacco: Never  Vaping Use   Vaping Use: Never used  Substance and Sexual Activity   Alcohol use: Never   Drug use: Never   Sexual activity: Yes  Other Topics Concern   Not on file  Social History Narrative   Not on file   Social Determinants of Health   Financial Resource Strain: Not on file  Food Insecurity: Not on file  Transportation Needs: Not on file  Physical Activity: Not on file  Stress: Not on file  Social Connections: Not on file  Intimate Partner Violence: Not on file    Outpatient Medications Prior to Visit  Medication Sig Dispense Refill   ibuprofen (ADVIL) 800 MG tablet Take 1 tablet (800 mg total) by mouth 3 (three) times daily. 30 tablet 0   predniSONE (STERAPRED UNI-PAK 21 TAB) 10 MG (21) TBPK tablet 6 tabs for 1 day, then 5 tabs for 1 das, then 4 tabs for 1 day, then 3 tabs for 1 day, 2 tabs for 1 day, then 1 tab for 1 day 21 tablet 0   traMADol (ULTRAM) 50 MG tablet Take 1 tablet (50 mg total) by mouth every 12 (twelve) hours as  needed. 15 tablet 0   No facility-administered medications prior to visit.    No Known Allergies  ROS Review of Systems  Constitutional:  Negative for chills, diaphoresis, fatigue, fever and unexpected weight change.  HENT: Negative.    Eyes:  Negative for photophobia and visual disturbance.  Respiratory:  Negative for apnea, shortness of breath and wheezing.   Cardiovascular:  Negative for chest pain.  Gastrointestinal:  Positive for vomiting. Negative for abdominal pain, anal bleeding and blood in stool.  Endocrine: Negative for polyphagia and polyuria.  Genitourinary:  Positive for difficulty urinating, frequency and urgency. Negative for hematuria.  Musculoskeletal:  Positive for arthralgias.  Neurological:  Negative for speech difficulty and weakness.  Psychiatric/Behavioral: Negative.       Objective:    Physical Exam Vitals and nursing note reviewed.  Constitutional:      General: He is not in  acute distress.    Appearance: Normal appearance. He is normal weight. He is not ill-appearing, toxic-appearing or diaphoretic.  HENT:     Head: Normocephalic and atraumatic.     Right Ear: Tympanic membrane, ear canal and external ear normal.     Left Ear: Tympanic membrane, ear canal and external ear normal.     Mouth/Throat:     Mouth: Mucous membranes are moist.     Pharynx: Oropharynx is clear. No oropharyngeal exudate or posterior oropharyngeal erythema.   Eyes:     General:        Right eye: No discharge.        Left eye: No discharge.     Extraocular Movements: Extraocular movements intact.     Conjunctiva/sclera: Conjunctivae normal.     Pupils: Pupils are equal, round, and reactive to light.  Cardiovascular:     Rate and Rhythm: Normal rate and regular rhythm.  Pulmonary:     Effort: Pulmonary effort is normal.     Breath sounds: Normal breath sounds.  Abdominal:     General: Abdomen is flat. Bowel sounds are normal. There is no distension.     Palpations: Abdomen is soft. There is no mass.     Tenderness: There is no abdominal tenderness. There is no guarding or rebound.     Hernia: No hernia is present.  Genitourinary:    Prostate: Enlarged and nodules present. Not tender.     Rectum: Guaiac result negative. No mass, tenderness, anal fissure, external hemorrhoid or internal hemorrhoid. Normal anal tone.    Musculoskeletal:     Cervical back: No rigidity or tenderness.  Lymphadenopathy:     Cervical: No cervical adenopathy.  Skin:    General: Skin is warm and dry.  Neurological:     Mental Status: He is alert and oriented to person, place, and time.  Psychiatric:        Mood and Affect: Mood normal.        Behavior: Behavior normal.        Thought Content: Thought content normal.    BP 120/74 (BP Location: Right Arm, Patient Position: Sitting, Cuff Size: Normal)    Pulse 87    Temp (!) 97.3 F (36.3 C) (Temporal)    Ht 5\' 8"  (1.727 m) Comment: 5' 8  1/4   Wt  142 lb 12.8 oz (64.8 kg)    SpO2 97%    BMI 21.71 kg/m  Wt Readings from Last 3 Encounters:  08/13/21 142 lb 12.8 oz (64.8 kg)  07/08/19 149 lb (67.6 kg)     Health Maintenance  Due  Topic Date Due   Hepatitis C Screening  Never done   TETANUS/TDAP  Never done   COLONOSCOPY (Pts 45-53yrs Insurance coverage will need to be confirmed)  Never done   Zoster Vaccines- Shingrix (1 of 2) Never done   Pneumonia Vaccine 67+ Years old (1 - PCV) Never done   INFLUENZA VACCINE  Never done    There are no preventive care reminders to display for this patient.  No results found for: TSH Lab Results  Component Value Date   WBC 5.9 08/13/2021   HGB 11.1 (L) 08/13/2021   HCT 35.1 (L) 08/13/2021   MCV 71.3 (L) 08/13/2021   PLT 506 (H) 08/13/2021   Lab Results  Component Value Date   NA 137 08/13/2021   K 4.6 08/13/2021   CO2 25 08/13/2021   GLUCOSE 96 08/13/2021   BUN 12 08/13/2021   CREATININE 0.76 08/13/2021   BILITOT 0.4 08/13/2021   AST 18 08/13/2021   ALT 10 08/13/2021   PROT 6.9 08/13/2021   CALCIUM 9.7 08/13/2021   No results found for: CHOL No results found for: HDL No results found for: LDLCALC No results found for: TRIG No results found for: CHOLHDL Lab Results  Component Value Date   HGBA1C 6.1 (H) 08/13/2021      Assessment & Plan:   Problem List Items Addressed This Visit       Respiratory   Sleep apnea   Relevant Orders   Ambulatory referral to Sleep Studies     Digestive   NSAID induced gastritis   Relevant Medications   pantoprazole (PROTONIX) 40 MG tablet   Other Relevant Orders   Ambulatory referral to Gastroenterology     Other   Elevated PSA   Relevant Orders   Ambulatory referral to Urology   RLS (restless legs syndrome)   Relevant Orders   Ambulatory referral to Sleep Studies   Healthcare maintenance - Primary   Relevant Orders   CBC (Completed)   Comprehensive metabolic panel (Completed)   Hemoglobin A1c (Completed)   Urinalysis,  Routine w reflex microscopic (Completed)   LDL cholesterol, direct (Completed)   Benign prostatic hyperplasia with urinary frequency   Relevant Medications   tamsulosin (FLOMAX) 0.4 MG CAPS capsule   Other Relevant Orders   PSA (Completed)   Ambulatory referral to Urology    Meds ordered this encounter  Medications   tamsulosin (FLOMAX) 0.4 MG CAPS capsule    Sig: Take 1 capsule (0.4 mg total) by mouth daily.    Dispense:  30 capsule    Refill:  3   pantoprazole (PROTONIX) 40 MG tablet    Sig: Take 1 tablet (40 mg total) by mouth daily.    Dispense:  30 tablet    Refill:  3    Follow-up: Return in about 4 weeks (around 09/10/2021).  .  Aleve and ibuprofen and just use Tylenol.  We will start Protonix daily.  We will start tamsulosin daily.  Referred to GI for his first colonoscopy with an EGD.  Sleep study for work-up of apnea and RLS.  Probable refer to neurology.  Suspect prostate cancer.  Information was given on health maintenance and disease invention for those over 16.  Information was also given about BPH and gastritis.  Libby Maw, MD

## 2021-08-16 DIAGNOSIS — Z1159 Encounter for screening for other viral diseases: Secondary | ICD-10-CM | POA: Insufficient documentation

## 2021-08-16 DIAGNOSIS — T39395A Adverse effect of other nonsteroidal anti-inflammatory drugs [NSAID], initial encounter: Secondary | ICD-10-CM | POA: Insufficient documentation

## 2021-08-16 DIAGNOSIS — R972 Elevated prostate specific antigen [PSA]: Secondary | ICD-10-CM | POA: Insufficient documentation

## 2021-08-16 DIAGNOSIS — G2581 Restless legs syndrome: Secondary | ICD-10-CM | POA: Insufficient documentation

## 2021-08-16 DIAGNOSIS — K2960 Other gastritis without bleeding: Secondary | ICD-10-CM | POA: Insufficient documentation

## 2021-08-16 DIAGNOSIS — N401 Enlarged prostate with lower urinary tract symptoms: Secondary | ICD-10-CM | POA: Insufficient documentation

## 2021-08-16 DIAGNOSIS — R0681 Apnea, not elsewhere classified: Secondary | ICD-10-CM | POA: Insufficient documentation

## 2021-08-16 DIAGNOSIS — Z0000 Encounter for general adult medical examination without abnormal findings: Secondary | ICD-10-CM | POA: Insufficient documentation

## 2021-08-16 DIAGNOSIS — R350 Frequency of micturition: Secondary | ICD-10-CM | POA: Insufficient documentation

## 2021-08-16 DIAGNOSIS — G4730 Sleep apnea, unspecified: Secondary | ICD-10-CM | POA: Insufficient documentation

## 2021-08-16 LAB — HEMOGLOBIN A1C
Hgb A1c MFr Bld: 6.1 % of total Hgb — ABNORMAL HIGH (ref <5.7)
Mean Plasma Glucose: 128 mg/dL
eAG (mmol/L): 7.1 mmol/L

## 2021-08-16 LAB — COMPREHENSIVE METABOLIC PANEL
AG Ratio: 1.5 (calc) (ref 1.0–2.5)
ALT: 10 U/L (ref 9–46)
AST: 18 U/L (ref 10–35)
Albumin: 4.1 g/dL (ref 3.6–5.1)
Alkaline phosphatase (APISO): 119 U/L (ref 35–144)
BUN: 12 mg/dL (ref 7–25)
CO2: 25 mmol/L (ref 20–32)
Calcium: 9.7 mg/dL (ref 8.6–10.3)
Chloride: 102 mmol/L (ref 98–110)
Creat: 0.76 mg/dL (ref 0.70–1.28)
Globulin: 2.8 g/dL (calc) (ref 1.9–3.7)
Glucose, Bld: 96 mg/dL (ref 65–99)
Potassium: 4.6 mmol/L (ref 3.5–5.3)
Sodium: 137 mmol/L (ref 135–146)
Total Bilirubin: 0.4 mg/dL (ref 0.2–1.2)
Total Protein: 6.9 g/dL (ref 6.1–8.1)

## 2021-08-16 LAB — HEPATITIS C ANTIBODY
Hepatitis C Ab: NONREACTIVE
SIGNAL TO CUT-OFF: 0.03 (ref <1.00)

## 2021-08-16 LAB — HIV ANTIBODY (ROUTINE TESTING W REFLEX): HIV 1&2 Ab, 4th Generation: NONREACTIVE

## 2021-08-16 LAB — URINALYSIS, ROUTINE W REFLEX MICROSCOPIC
Bacteria, UA: NONE SEEN /HPF
Bilirubin Urine: NEGATIVE
Glucose, UA: NEGATIVE
Hgb urine dipstick: NEGATIVE
Hyaline Cast: NONE SEEN /LPF
Leukocytes,Ua: NEGATIVE
Nitrite: NEGATIVE
RBC / HPF: NONE SEEN /HPF (ref 0–2)
Specific Gravity, Urine: 1.022 (ref 1.001–1.035)
Squamous Epithelial / LPF: NONE SEEN /HPF (ref <=5)
pH: 5.5 (ref 5.0–8.0)

## 2021-08-16 LAB — CBC
HCT: 35.1 % — ABNORMAL LOW (ref 38.5–50.0)
Hemoglobin: 11.1 g/dL — ABNORMAL LOW (ref 13.2–17.1)
MCH: 22.6 pg — ABNORMAL LOW (ref 27.0–33.0)
MCHC: 31.6 g/dL — ABNORMAL LOW (ref 32.0–36.0)
MCV: 71.3 fL — ABNORMAL LOW (ref 80.0–100.0)
MPV: 9.1 fL (ref 7.5–12.5)
Platelets: 506 10*3/uL — ABNORMAL HIGH (ref 140–400)
RBC: 4.92 10*6/uL (ref 4.20–5.80)
RDW: 13.2 % (ref 11.0–15.0)
WBC: 5.9 10*3/uL (ref 3.8–10.8)

## 2021-08-16 LAB — PSA: PSA: 3535.93 ng/mL — ABNORMAL HIGH (ref <=4.00)

## 2021-08-16 LAB — LDL CHOLESTEROL, DIRECT: Direct LDL: 116 mg/dL — ABNORMAL HIGH (ref <100)

## 2021-08-16 NOTE — Addendum Note (Signed)
Addended by: Jon Billings on: 08/16/2021 08:10 AM   Modules accepted: Orders

## 2021-08-17 ENCOUNTER — Encounter: Payer: Self-pay | Admitting: Family Medicine

## 2021-08-17 ENCOUNTER — Other Ambulatory Visit: Payer: Self-pay

## 2021-08-17 ENCOUNTER — Ambulatory Visit (INDEPENDENT_AMBULATORY_CARE_PROVIDER_SITE_OTHER): Payer: Medicare HMO | Admitting: Family Medicine

## 2021-08-17 ENCOUNTER — Ambulatory Visit: Payer: Medicare HMO

## 2021-08-17 ENCOUNTER — Ambulatory Visit (INDEPENDENT_AMBULATORY_CARE_PROVIDER_SITE_OTHER): Payer: Medicare HMO

## 2021-08-17 VITALS — BP 118/68 | HR 92 | Temp 98.2°F | Ht 68.0 in | Wt 141.6 lb

## 2021-08-17 DIAGNOSIS — R102 Pelvic and perineal pain: Secondary | ICD-10-CM | POA: Diagnosis not present

## 2021-08-17 DIAGNOSIS — D509 Iron deficiency anemia, unspecified: Secondary | ICD-10-CM | POA: Insufficient documentation

## 2021-08-17 DIAGNOSIS — R972 Elevated prostate specific antigen [PSA]: Secondary | ICD-10-CM

## 2021-08-17 DIAGNOSIS — D649 Anemia, unspecified: Secondary | ICD-10-CM

## 2021-08-17 DIAGNOSIS — E119 Type 2 diabetes mellitus without complications: Secondary | ICD-10-CM | POA: Diagnosis not present

## 2021-08-17 LAB — IRON,TIBC AND FERRITIN PANEL
%SAT: 5 % (calc) — ABNORMAL LOW (ref 20–48)
Ferritin: 197 ng/mL (ref 24–380)
Iron: 14 ug/dL — ABNORMAL LOW (ref 50–180)
TIBC: 284 mcg/dL (calc) (ref 250–425)

## 2021-08-17 MED ORDER — HYDROCODONE-ACETAMINOPHEN 5-325 MG PO TABS: 1.0000 | ORAL_TABLET | Freq: Four times a day (QID) | ORAL | 0 refills | Status: DC | PRN

## 2021-08-17 MED ORDER — METFORMIN HCL ER 500 MG PO TB24: ORAL_TABLET | ORAL | 2 refills | Status: DC

## 2021-08-17 NOTE — Progress Notes (Signed)
Established Patient Office Visit  Subjective:  Patient ID: Steven Jensen, male    DOB: 03-13-1951  Age: 71 y.o. MRN: [id number]  CC:  Chief Complaint  Patient presents with   Back Pain    Lower back pains x 1 week becoming worse x 2 days.     HPI Steven Jensen presents for a discussion of lab results and low back pain.  Low back pain has been going on for some time now.  There was no injury.  There is no radiation of pain.  Denies paresthesias or weakness in his lower extremities.  Ongoing issues with BPH but denies change in bowel function.  Hemoglobin A1c was mildly elevated.  He has no history of diabetes.  Blood work showed a microcytic anemia.  His stool or urine.  Denies melena.  No past medical history on file.  Past Surgical History:  Procedure Laterality Date   EYE SURGERY      Family History  Family history unknown: Yes    Social History   Socioeconomic History   Marital status: Legally Separated    Spouse name: Not on file   Number of children: Not on file   Years of education: Not on file   Highest education level: Not on file  Occupational History   Not on file  Tobacco Use   Smoking status: Never   Smokeless tobacco: Never  Vaping Use   Vaping Use: Never used  Substance and Sexual Activity   Alcohol use: Never   Drug use: Never   Sexual activity: Yes  Other Topics Concern   Not on file  Social History Narrative   Not on file   Social Determinants of Health   Financial Resource Strain: Not on file  Food Insecurity: Not on file  Transportation Needs: Not on file  Physical Activity: Not on file  Stress: Not on file  Social Connections: Not on file  Intimate Partner Violence: Not on file    Outpatient Medications Prior to Visit  Medication Sig Dispense Refill   pantoprazole (PROTONIX) 40 MG tablet Take 1 tablet (40 mg total) by mouth daily. 30 tablet 3   tamsulosin (FLOMAX) 0.4 MG CAPS capsule Take 1 capsule (0.4 mg total) by mouth  daily. 30 capsule 3   No facility-administered medications prior to visit.    No Known Allergies  ROS Review of Systems  Constitutional: Negative.   HENT: Negative.    Eyes:  Negative for photophobia and visual disturbance.  Respiratory: Negative.    Cardiovascular: Negative.   Gastrointestinal: Negative.  Negative for anal bleeding and blood in stool.  Endocrine: Negative for polyphagia and polyuria.  Genitourinary:  Negative for hematuria.  Musculoskeletal:  Positive for back pain. Negative for myalgias.  Neurological:  Negative for speech difficulty and weakness.  Psychiatric/Behavioral: Negative.       Objective:    Physical Exam Vitals and nursing note reviewed.  Constitutional:      General: He is not in acute distress.    Appearance: Normal appearance. He is normal weight. He is not ill-appearing, toxic-appearing or diaphoretic.  HENT:     Head: Normocephalic and atraumatic.     Right Ear: External ear normal.     Left Ear: External ear normal.  Eyes:     General: No scleral icterus.       Right eye: No discharge.        Left eye: No discharge.     Extraocular Movements: Extraocular movements intact.  Conjunctiva/sclera: Conjunctivae normal.  Pulmonary:     Effort: Pulmonary effort is normal.  Musculoskeletal:     Lumbar back: No spasms, tenderness or bony tenderness. Normal range of motion. Negative right straight leg raise test and negative left straight leg raise test.       Back:  Skin:    General: Skin is warm and dry.  Neurological:     Mental Status: He is alert and oriented to person, place, and time.     Cranial Nerves: No dysarthria or facial asymmetry.     Motor: Motor function is intact. No weakness.     Deep Tendon Reflexes:     Reflex Scores:      Patellar reflexes are 2+ on the right side and 2+ on the left side.      Achilles reflexes are 1+ on the right side and 1+ on the left side. Psychiatric:        Mood and Affect: Mood normal.         Behavior: Behavior normal.    BP 118/68 (BP Location: Right Arm, Patient Position: Sitting, Cuff Size: Normal)    Pulse 92    Temp 98.2 F (36.8 C) (Temporal)    Ht 5\' 8"  (1.727 m)    Wt 141 lb 9.6 oz (64.2 kg)    SpO2 98%    BMI 21.53 kg/m  Wt Readings from Last 3 Encounters:  08/17/21 141 lb 9.6 oz (64.2 kg)  08/13/21 142 lb 12.8 oz (64.8 kg)  07/08/19 149 lb (67.6 kg)     Health Maintenance Due  Topic Date Due   FOOT EXAM  Never done   OPHTHALMOLOGY EXAM  Never done   COLONOSCOPY (Pts 45-72yrs Insurance coverage will need to be confirmed)  Never done    There are no preventive care reminders to display for this patient.  No results found for: TSH Lab Results  Component Value Date   WBC 5.9 08/13/2021   HGB 11.1 (L) 08/13/2021   HCT 35.1 (L) 08/13/2021   MCV 71.3 (L) 08/13/2021   PLT 506 (H) 08/13/2021   Lab Results  Component Value Date   NA 137 08/13/2021   K 4.6 08/13/2021   CO2 25 08/13/2021   GLUCOSE 96 08/13/2021   BUN 12 08/13/2021   CREATININE 0.76 08/13/2021   BILITOT 0.4 08/13/2021   AST 18 08/13/2021   ALT 10 08/13/2021   PROT 6.9 08/13/2021   CALCIUM 9.7 08/13/2021   No results found for: CHOL No results found for: HDL No results found for: LDLCALC No results found for: TRIG No results found for: CHOLHDL Lab Results  Component Value Date   HGBA1C 6.1 (H) 08/13/2021      Assessment & Plan:   Problem List Items Addressed This Visit       Endocrine   Type 2 diabetes mellitus without complication, without long-term current use of insulin (HCC)   Relevant Medications   metFORMIN (GLUCOPHAGE XR) 500 MG 24 hr tablet     Other   Elevated PSA   Anemia - Primary   Relevant Orders   B12 and Folate Panel   Iron, TIBC and Ferritin Panel   Other Visit Diagnoses     Pelvic pain       Relevant Medications   HYDROcodone-acetaminophen (NORCO) 5-325 MG tablet   Other Relevant Orders   DG Hip Unilat W OR W/O Pelvis 2-3 Views Right   DG  Hip Unilat W OR W/O Pelvis 2-3 Views  Left       Meds ordered this encounter  Medications   metFORMIN (GLUCOPHAGE XR) 500 MG 24 hr tablet    Sig: Take one nightly before bed.    Dispense:  30 tablet    Refill:  2   HYDROcodone-acetaminophen (NORCO) 5-325 MG tablet    Sig: Take 1 tablet by mouth every 6 (six) hours as needed for moderate pain.    Dispense:  60 tablet    Refill:  0    Follow-up: Return Keep already scheduled appointment.   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  Checking plain films today.  Norco as needed for pain.  We will start metformin at night.  Discussed side effects.  They will let me know if it is not tolerated.  Urology consultation is pending. Libby Maw, MD

## 2021-08-19 LAB — B12 AND FOLATE PANEL
Folate: 23.7 ng/mL (ref >5.9)
Vitamin B-12: 1024 pg/mL — ABNORMAL HIGH (ref 211–911)

## 2021-08-30 ENCOUNTER — Other Ambulatory Visit: Payer: Self-pay | Admitting: Family Medicine

## 2021-08-30 ENCOUNTER — Telehealth: Payer: Self-pay | Admitting: Family Medicine

## 2021-08-30 DIAGNOSIS — R102 Pelvic and perineal pain: Secondary | ICD-10-CM

## 2021-08-30 MED ORDER — HYDROCODONE-ACETAMINOPHEN 5-325 MG PO TABS: 1.0000 | ORAL_TABLET | Freq: Four times a day (QID) | ORAL | 0 refills | Status: DC | PRN

## 2021-08-30 NOTE — Telephone Encounter (Signed)
Pt is in pain and has requested to have the pain medication sent to pharmacy. I told him I couldn't guarantee that and you would get to it asap.

## 2021-08-30 NOTE — Telephone Encounter (Signed)
Patient aware Rx sent in  

## 2021-08-30 NOTE — Telephone Encounter (Signed)
Refill request for pending Rx. Patient had this filled on 08/17/21 I called the pharmacy to see if it was filled per pharmacy only 7 day supply was filled due to insurance purposes due to first time being sent in a new Rx will need to be sent in for original amount and they will fill that amount. Please advise

## 2021-08-31 ENCOUNTER — Encounter: Payer: Self-pay | Admitting: Family Medicine

## 2021-08-31 ENCOUNTER — Other Ambulatory Visit: Payer: Self-pay

## 2021-08-31 ENCOUNTER — Ambulatory Visit (INDEPENDENT_AMBULATORY_CARE_PROVIDER_SITE_OTHER): Payer: Medicare HMO | Admitting: Family Medicine

## 2021-08-31 ENCOUNTER — Ambulatory Visit (INDEPENDENT_AMBULATORY_CARE_PROVIDER_SITE_OTHER): Payer: Medicare HMO

## 2021-08-31 VITALS — BP 110/68 | HR 88 | Temp 98.3°F | Ht 68.0 in | Wt 143.6 lb

## 2021-08-31 DIAGNOSIS — G8929 Other chronic pain: Secondary | ICD-10-CM | POA: Diagnosis not present

## 2021-08-31 DIAGNOSIS — D509 Iron deficiency anemia, unspecified: Secondary | ICD-10-CM

## 2021-08-31 DIAGNOSIS — M545 Low back pain, unspecified: Secondary | ICD-10-CM

## 2021-08-31 DIAGNOSIS — R972 Elevated prostate specific antigen [PSA]: Secondary | ICD-10-CM

## 2021-08-31 DIAGNOSIS — E119 Type 2 diabetes mellitus without complications: Secondary | ICD-10-CM | POA: Diagnosis not present

## 2021-08-31 DIAGNOSIS — M5137 Other intervertebral disc degeneration, lumbosacral region: Secondary | ICD-10-CM | POA: Diagnosis not present

## 2021-08-31 NOTE — Progress Notes (Addendum)
Established Patient Office Visit  Subjective:  Patient ID: Steven Jensen, male    DOB: 1951/04/06  Age: 71 y.o. MRN: [id number]  CC:  Chief Complaint  Patient presents with   Pain    Back pains that go down to legs.     HPI Steven Jensen presents for follow-up of elevated PSA with low back pain, type 2 diabetes, microcytic anemia.  Patient says he has not heard from urology about his appointment.  He assures me his cell phone is working.  No issues taking metformin.  He is taking a multivitamin with iron.  Continues with Norco as needed for back pain.  Plain films of his pelvis were without evidence of metastatic disease.  No past medical history on file.  Past Surgical History:  Procedure Laterality Date   EYE SURGERY      Family History  Family history unknown: Yes    Social History   Socioeconomic History   Marital status: Legally Separated    Spouse name: Not on file   Number of children: Not on file   Years of education: Not on file   Highest education level: Not on file  Occupational History   Not on file  Tobacco Use   Smoking status: Never   Smokeless tobacco: Never  Vaping Use   Vaping Use: Never used  Substance and Sexual Activity   Alcohol use: Never   Drug use: Never   Sexual activity: Yes  Other Topics Concern   Not on file  Social History Narrative   Not on file   Social Determinants of Health   Financial Resource Strain: Not on file  Food Insecurity: Not on file  Transportation Needs: Not on file  Physical Activity: Not on file  Stress: Not on file  Social Connections: Not on file  Intimate Partner Violence: Not on file    Outpatient Medications Prior to Visit  Medication Sig Dispense Refill   HYDROcodone-acetaminophen (NORCO) 5-325 MG tablet Take 1 tablet by mouth every 6 (six) hours as needed for moderate pain. 60 tablet 0   metFORMIN (GLUCOPHAGE XR) 500 MG 24 hr tablet Take one nightly before bed. 30 tablet 2   Multiple  Vitamin (MULTIVITAMIN) capsule Take 1 capsule by mouth daily. Multi vitamin with iron     pantoprazole (PROTONIX) 40 MG tablet Take 1 tablet (40 mg total) by mouth daily. 30 tablet 3   tamsulosin (FLOMAX) 0.4 MG CAPS capsule Take 1 capsule (0.4 mg total) by mouth daily. 30 capsule 3   No facility-administered medications prior to visit.    No Known Allergies  ROS Review of Systems  Constitutional:  Negative for chills, diaphoresis, fatigue, fever and unexpected weight change.  HENT: Negative.    Eyes:  Negative for photophobia and visual disturbance.  Respiratory: Negative.    Cardiovascular: Negative.   Gastrointestinal: Negative.   Genitourinary:  Positive for difficulty urinating, frequency and urgency.  Musculoskeletal:  Positive for back pain. Negative for gait problem.  Neurological:  Negative for weakness and numbness.  Psychiatric/Behavioral: Negative.       Objective:    Physical Exam Vitals and nursing note reviewed.  Constitutional:      General: He is not in acute distress.    Appearance: Normal appearance. He is not ill-appearing, toxic-appearing or diaphoretic.  HENT:     Head: Normocephalic and atraumatic.     Right Ear: External ear normal.     Left Ear: External ear normal.  Eyes:  General:        Right eye: No discharge.        Left eye: No discharge.     Conjunctiva/sclera: Conjunctivae normal.  Cardiovascular:     Rate and Rhythm: Normal rate and regular rhythm.  Pulmonary:     Effort: Pulmonary effort is normal.     Breath sounds: Normal breath sounds.  Musculoskeletal:     Lumbar back: No tenderness or bony tenderness. Normal range of motion.  Skin:    General: Skin is warm and dry.  Neurological:     Mental Status: He is alert and oriented to person, place, and time.     Motor: No weakness.  Psychiatric:        Mood and Affect: Mood normal.        Behavior: Behavior normal.    BP 110/68 (BP Location: Right Arm, Patient Position:  Sitting, Cuff Size: Normal)    Pulse 88    Temp 98.3 F (36.8 C) (Temporal)    Ht 5\' 8"  (1.727 m)    Wt 143 lb 9.6 oz (65.1 kg)    SpO2 93%    BMI 21.83 kg/m  Wt Readings from Last 3 Encounters:  08/31/21 143 lb 9.6 oz (65.1 kg)  08/17/21 141 lb 9.6 oz (64.2 kg)  08/13/21 142 lb 12.8 oz (64.8 kg)     Health Maintenance Due  Topic Date Due   FOOT EXAM  Never done   URINE MICROALBUMIN  Never done   OPHTHALMOLOGY EXAM  08/17/2021    There are no preventive care reminders to display for this patient.  No results found for: TSH Lab Results  Component Value Date   WBC 5.9 08/13/2021   HGB 11.1 (L) 08/13/2021   HCT 35.1 (L) 08/13/2021   MCV 71.3 (L) 08/13/2021   PLT 506 (H) 08/13/2021   Lab Results  Component Value Date   NA 137 08/13/2021   K 4.6 08/13/2021   CO2 25 08/13/2021   GLUCOSE 96 08/13/2021   BUN 12 08/13/2021   CREATININE 0.76 08/13/2021   BILITOT 0.4 08/13/2021   AST 18 08/13/2021   ALT 10 08/13/2021   PROT 6.9 08/13/2021   CALCIUM 9.7 08/13/2021   No results found for: CHOL No results found for: HDL No results found for: LDLCALC No results found for: TRIG No results found for: CHOLHDL Lab Results  Component Value Date   HGBA1C 6.1 (H) 08/13/2021      Assessment & Plan:   Problem List Items Addressed This Visit       Endocrine   Type 2 diabetes mellitus without complication, without long-term current use of insulin (HCC)     Other   Elevated PSA, greater than or equal to 20 ng/ml - Primary   Relevant Orders   DG Lumbar Spine Complete   Ambulatory referral to Urology   Microcytic anemia   Other Visit Diagnoses     Chronic low back pain without sciatica, unspecified back pain laterality       Relevant Orders   DG Lumbar Spine Complete   Ambulatory referral to Urology       No orders of the defined types were placed in this encounter.   Follow-up: Return in about 1 month (around 09/28/2021).  Continue with metformin for management of  diabetes.  Checking plain films of lower back and pelvis to look for evidence of metastases.  Microcytic anemia versus anemia of chronic disease.  PSA of over 3000 with a  urgent referral to urology.  Chronic lower back pain and or lumbar spine/pelvis with metastatic disease.  Libby Maw, MD

## 2021-09-01 DIAGNOSIS — R3914 Feeling of incomplete bladder emptying: Secondary | ICD-10-CM | POA: Diagnosis not present

## 2021-09-01 DIAGNOSIS — R3912 Poor urinary stream: Secondary | ICD-10-CM | POA: Diagnosis not present

## 2021-09-01 DIAGNOSIS — N401 Enlarged prostate with lower urinary tract symptoms: Secondary | ICD-10-CM | POA: Diagnosis not present

## 2021-09-01 DIAGNOSIS — N5201 Erectile dysfunction due to arterial insufficiency: Secondary | ICD-10-CM | POA: Diagnosis not present

## 2021-09-01 DIAGNOSIS — R351 Nocturia: Secondary | ICD-10-CM | POA: Diagnosis not present

## 2021-09-01 DIAGNOSIS — R972 Elevated prostate specific antigen [PSA]: Secondary | ICD-10-CM | POA: Diagnosis not present

## 2021-09-03 DIAGNOSIS — R972 Elevated prostate specific antigen [PSA]: Secondary | ICD-10-CM | POA: Diagnosis not present

## 2021-09-03 DIAGNOSIS — C61 Malignant neoplasm of prostate: Secondary | ICD-10-CM | POA: Diagnosis not present

## 2021-09-10 ENCOUNTER — Ambulatory Visit: Payer: Medicare HMO | Admitting: Family Medicine

## 2021-09-17 ENCOUNTER — Other Ambulatory Visit: Payer: Self-pay

## 2021-09-17 ENCOUNTER — Other Ambulatory Visit (HOSPITAL_COMMUNITY): Payer: Self-pay | Admitting: Urology

## 2021-09-17 ENCOUNTER — Ambulatory Visit (INDEPENDENT_AMBULATORY_CARE_PROVIDER_SITE_OTHER): Payer: Medicare HMO | Admitting: Family Medicine

## 2021-09-17 ENCOUNTER — Encounter: Payer: Self-pay | Admitting: Family Medicine

## 2021-09-17 ENCOUNTER — Other Ambulatory Visit: Payer: Self-pay | Admitting: Urology

## 2021-09-17 VITALS — BP 120/60 | HR 65 | Temp 97.3°F | Ht 68.0 in | Wt 139.6 lb

## 2021-09-17 DIAGNOSIS — G8929 Other chronic pain: Secondary | ICD-10-CM | POA: Diagnosis not present

## 2021-09-17 DIAGNOSIS — E119 Type 2 diabetes mellitus without complications: Secondary | ICD-10-CM

## 2021-09-17 DIAGNOSIS — R972 Elevated prostate specific antigen [PSA]: Secondary | ICD-10-CM

## 2021-09-17 DIAGNOSIS — K2960 Other gastritis without bleeding: Secondary | ICD-10-CM | POA: Diagnosis not present

## 2021-09-17 DIAGNOSIS — M545 Low back pain, unspecified: Secondary | ICD-10-CM | POA: Diagnosis not present

## 2021-09-17 DIAGNOSIS — T39395A Adverse effect of other nonsteroidal anti-inflammatory drugs [NSAID], initial encounter: Secondary | ICD-10-CM

## 2021-09-17 DIAGNOSIS — C61 Malignant neoplasm of prostate: Secondary | ICD-10-CM

## 2021-09-17 MED ORDER — PANTOPRAZOLE SODIUM 40 MG PO TBEC: 40.0000 mg | DELAYED_RELEASE_TABLET | Freq: Every day | ORAL | 0 refills | Status: DC

## 2021-09-17 MED ORDER — METFORMIN HCL ER 500 MG PO TB24: ORAL_TABLET | ORAL | 2 refills | Status: DC

## 2021-09-17 NOTE — Progress Notes (Signed)
? ?Established Patient Office Visit ? ?Subjective:  ?Patient ID: Steven Jensen, male    DOB: 27-Dec-1950  Age: 71 y.o. MRN: [id number] ? ?CC:  ?Chief Complaint  ?Patient presents with  ? Diabetes  ?  1 month follow up DM. No concerns  ? ? ?HPI ?Steven Jensen presents for follow-up of diabetes, NSAID induced gastritis, elevated PSA and lower back pain.  Back pain waxes and wanes and remains responsive to the Norco.  Protonix is helped his gastritis.  No problems taking metformin.  He is scheduled for a CT of his pelvis and bone scan in a week.  Continues to work every day at [employer]. He is a man of faith.  ? ?No past medical history on file. ? ?Past Surgical History:  ?Procedure Laterality Date  ? EYE SURGERY    ? ? ?Family History  ?Family history unknown: Yes  ? ? ?Social History  ? ?Socioeconomic History  ? Marital status: Legally Separated  ?  Spouse name: Not on file  ? Number of children: Not on file  ? Years of education: Not on file  ? Highest education level: Not on file  ?Occupational History  ? Not on file  ?Tobacco Use  ? Smoking status: Never  ? Smokeless tobacco: Never  ?Vaping Use  ? Vaping Use: Never used  ?Substance and Sexual Activity  ? Alcohol use: Never  ? Drug use: Never  ? Sexual activity: Yes  ?Other Topics Concern  ? Not on file  ?Social History Narrative  ? Not on file  ? ?Social Determinants of Health  ? ?Financial Resource Strain: Not on file  ?Food Insecurity: Not on file  ?Transportation Needs: Not on file  ?Physical Activity: Not on file  ?Stress: Not on file  ?Social Connections: Not on file  ?Intimate Partner Violence: Not on file  ? ? ?Outpatient Medications Prior to Visit  ?Medication Sig Dispense Refill  ? HYDROcodone-acetaminophen (NORCO) 5-325 MG tablet Take 1 tablet by mouth every 6 (six) hours as needed for moderate pain. 60 tablet 0  ? Multiple Vitamin (MULTIVITAMIN) capsule Take 1 capsule by mouth daily. Multi vitamin with iron    ? tamsulosin (FLOMAX) 0.4 MG CAPS  capsule Take 1 capsule (0.4 mg total) by mouth daily. 30 capsule 3  ? metFORMIN (GLUCOPHAGE XR) 500 MG 24 hr tablet Take one nightly before bed. 30 tablet 2  ? pantoprazole (PROTONIX) 40 MG tablet Take 1 tablet (40 mg total) by mouth daily. 30 tablet 3  ? ?No facility-administered medications prior to visit.  ? ? ?No Known Allergies ? ?ROS ?Review of Systems  ?Constitutional:  Negative for chills, diaphoresis, fatigue, fever and unexpected weight change.  ?HENT: Negative.    ?Eyes:  Negative for photophobia and visual disturbance.  ?Respiratory:  Negative for chest tightness, shortness of breath and wheezing.   ?Cardiovascular: Negative.   ?Gastrointestinal: Negative.   ?Genitourinary: Negative.   ?Musculoskeletal:  Positive for back pain. Negative for gait problem.  ?Neurological:  Negative for speech difficulty and weakness.  ?Psychiatric/Behavioral: Negative.    ? ?  ?Objective:  ?  ?Physical Exam ?Vitals and nursing note reviewed.  ?Constitutional:   ?   General: He is not in acute distress. ?   Appearance: Normal appearance. He is normal weight. He is not ill-appearing, toxic-appearing or diaphoretic.  ?HENT:  ?   Head: Normocephalic and atraumatic.  ?   Right Ear: External ear normal.  ?   Left Ear: External ear normal.  ?  Eyes:  ?   General: No scleral icterus.    ?   Right eye: No discharge.     ?   Left eye: No discharge.  ?   Extraocular Movements: Extraocular movements intact.  ?   Conjunctiva/sclera: Conjunctivae normal.  ?Pulmonary:  ?   Effort: Pulmonary effort is normal.  ?Skin: ?   General: Skin is warm and dry.  ?Neurological:  ?   General: No focal deficit present.  ?   Mental Status: He is alert and oriented to person, place, and time.  ?Psychiatric:     ?   Mood and Affect: Mood normal.     ?   Behavior: Behavior normal.  ? ? ?BP 120/60 (BP Location: Right Arm, Patient Position: Sitting, Cuff Size: Normal)   Pulse 65   Temp (!) 97.3 ?F (36.3 ?C) (Temporal)   Ht '5\' 8"'$  (1.727 m)   Wt 139 lb 9.6  oz (63.3 kg)   SpO2 97%   BMI 21.23 kg/m?  ?Wt Readings from Last 3 Encounters:  ?09/17/21 139 lb 9.6 oz (63.3 kg)  ?08/31/21 143 lb 9.6 oz (65.1 kg)  ?08/17/21 141 lb 9.6 oz (64.2 kg)  ? ? ? ?Health Maintenance Due  ?Topic Date Due  ? FOOT EXAM  Never done  ? URINE MICROALBUMIN  Never done  ? ? ?There are no preventive care reminders to display for this patient. ? ?No results found for: TSH ?Lab Results  ?Component Value Date  ? WBC 5.9 08/13/2021  ? HGB 11.1 (L) 08/13/2021  ? HCT 35.1 (L) 08/13/2021  ? MCV 71.3 (L) 08/13/2021  ? PLT 506 (H) 08/13/2021  ? ?Lab Results  ?Component Value Date  ? NA 137 08/13/2021  ? K 4.6 08/13/2021  ? CO2 25 08/13/2021  ? GLUCOSE 96 08/13/2021  ? BUN 12 08/13/2021  ? CREATININE 0.76 08/13/2021  ? BILITOT 0.4 08/13/2021  ? AST 18 08/13/2021  ? ALT 10 08/13/2021  ? PROT 6.9 08/13/2021  ? CALCIUM 9.7 08/13/2021  ? ?No results found for: CHOL ?No results found for: HDL ?No results found for: [place] ?No results found for: TRIG ?No results found for: CHOLHDL ?Lab Results  ?Component Value Date  ? HGBA1C 6.1 (H) 08/13/2021  ? ? ?  ?Assessment & Plan:  ? ?Problem List Items Addressed This Visit   ? ?  ? Digestive  ? NSAID induced gastritis  ? Relevant Medications  ? pantoprazole (PROTONIX) 40 MG tablet  ?  ? Endocrine  ? Type 2 diabetes mellitus without complication, without long-term current use of insulin ([place])  ? Relevant Medications  ? metFORMIN (GLUCOPHAGE XR) 500 MG 24 hr tablet  ?  ? Other  ? Elevated PSA, greater than or equal to 20 ng/ml - Primary  ? ?Other Visit Diagnoses   ? ? Chronic low back pain without sciatica, unspecified back pain laterality      ? ?  ? ? ?Meds ordered this encounter  ?Medications  ? pantoprazole (PROTONIX) 40 MG tablet  ?  Sig: Take 1 tablet (40 mg total) by mouth daily.  ?  Dispense:  90 tablet  ?  Refill:  0  ? metFORMIN (GLUCOPHAGE XR) 500 MG 24 hr tablet  ?  Sig: Take one nightly before bed.  ?  Dispense:  90 tablet  ?  Refill:  2  ? ? ?Follow-up:  Return in about 3 months (around 12/18/2021).  ?Continue metformin and Protonix.  Norco as needed. ? ?Gwyndolyn Saxon  Krystal Clark, MD ?

## 2021-09-24 ENCOUNTER — Encounter (HOSPITAL_COMMUNITY)
Admission: RE | Admit: 2021-09-24 | Discharge: 2021-09-24 | Disposition: A | Discharge status: Home / Self Care | Payer: Medicare HMO | Source: Ambulatory Visit | Attending: Urology | Admitting: Urology

## 2021-09-24 ENCOUNTER — Other Ambulatory Visit: Payer: Self-pay

## 2021-09-24 DIAGNOSIS — C61 Malignant neoplasm of prostate: Secondary | ICD-10-CM | POA: Diagnosis not present

## 2021-09-24 DIAGNOSIS — N134 Hydroureter: Secondary | ICD-10-CM | POA: Diagnosis not present

## 2021-09-24 DIAGNOSIS — N1330 Unspecified hydronephrosis: Secondary | ICD-10-CM | POA: Diagnosis not present

## 2021-09-24 DIAGNOSIS — M170 Bilateral primary osteoarthritis of knee: Secondary | ICD-10-CM | POA: Diagnosis not present

## 2021-09-24 DIAGNOSIS — I700 Atherosclerosis of aorta: Secondary | ICD-10-CM | POA: Diagnosis not present

## 2021-09-24 MED ORDER — TECHNETIUM TC 99M MEDRONATE IV KIT
21.8000 | PACK | Freq: Once | INTRAVENOUS | Status: AC
  Administered 2021-09-24: 21.8 via INTRAVENOUS

## 2021-10-01 DIAGNOSIS — C61 Malignant neoplasm of prostate: Secondary | ICD-10-CM | POA: Diagnosis not present

## 2021-10-01 DIAGNOSIS — N130 Hydronephrosis with ureteropelvic junction obstruction: Secondary | ICD-10-CM | POA: Diagnosis not present

## 2021-10-01 DIAGNOSIS — N401 Enlarged prostate with lower urinary tract symptoms: Secondary | ICD-10-CM | POA: Diagnosis not present

## 2021-10-01 DIAGNOSIS — R3914 Feeling of incomplete bladder emptying: Secondary | ICD-10-CM | POA: Diagnosis not present

## 2021-10-04 ENCOUNTER — Telehealth: Payer: Self-pay | Admitting: Oncology

## 2021-10-04 NOTE — Telephone Encounter (Signed)
Scheduled appt per 3/27 referral. Pt is aware of appt date and time. Pt is aware to arrive 15 mins prior to appt time and to bring and updated insurance card. Pt is aware of appt location.   ?

## 2021-10-06 ENCOUNTER — Telehealth: Payer: Self-pay | Admitting: Oncology

## 2021-10-06 NOTE — Telephone Encounter (Signed)
R/s pt's appt time due to provider being on call. Called pt, no answer. Left msg with new appt time.  ?

## 2021-10-06 NOTE — Telephone Encounter (Signed)
Patient called to confirmed new patient appointment. Patient aware.  ?

## 2021-10-20 ENCOUNTER — Telehealth: Payer: Self-pay | Admitting: Oncology

## 2021-10-20 NOTE — Telephone Encounter (Signed)
Attempted to contact patient to confirm new patient appt for this Friday. No answer so voicemail was left to call back to confirm  ?

## 2021-10-21 ENCOUNTER — Telehealth: Payer: Self-pay

## 2021-10-21 NOTE — Telephone Encounter (Signed)
TC to patient. Left VM reminding patient of apt with Dr. Alen Blew on 10/22/21 @ 11 AM. ?

## 2021-10-22 ENCOUNTER — Ambulatory Visit: Payer: Medicare HMO | Admitting: Oncology

## 2021-10-22 ENCOUNTER — Other Ambulatory Visit: Payer: Self-pay

## 2021-10-22 ENCOUNTER — Inpatient Hospital Stay: Payer: Medicare HMO | Attending: Oncology | Admitting: Oncology

## 2021-10-22 VITALS — BP 116/81 | HR 88 | Temp 97.8°F | Resp 18 | Ht 68.0 in | Wt 142.4 lb

## 2021-10-22 DIAGNOSIS — Z7984 Long term (current) use of oral hypoglycemic drugs: Secondary | ICD-10-CM | POA: Diagnosis not present

## 2021-10-22 DIAGNOSIS — E119 Type 2 diabetes mellitus without complications: Secondary | ICD-10-CM | POA: Diagnosis not present

## 2021-10-22 DIAGNOSIS — M549 Dorsalgia, unspecified: Secondary | ICD-10-CM

## 2021-10-22 DIAGNOSIS — C786 Secondary malignant neoplasm of retroperitoneum and peritoneum: Secondary | ICD-10-CM | POA: Diagnosis not present

## 2021-10-22 DIAGNOSIS — C7951 Secondary malignant neoplasm of bone: Secondary | ICD-10-CM | POA: Diagnosis not present

## 2021-10-22 DIAGNOSIS — C61 Malignant neoplasm of prostate: Secondary | ICD-10-CM | POA: Diagnosis not present

## 2021-10-22 DIAGNOSIS — Z5111 Encounter for antineoplastic chemotherapy: Secondary | ICD-10-CM | POA: Insufficient documentation

## 2021-10-22 MED ORDER — PROCHLORPERAZINE MALEATE 10 MG PO TABS: 10.0000 mg | ORAL_TABLET | Freq: Four times a day (QID) | ORAL | 0 refills | Status: DC | PRN

## 2021-10-22 NOTE — Progress Notes (Signed)
?Reason for the request:    Prostate cancer ? ?HPI: I was asked by Dr. Claudia Desanctis to evaluate Steven Jensen for the evaluation of prostate cancer.  He is a 71 year old man with history of diabetes but no other comorbid conditions.  He has has not been seeking routine medical care and noted to have an elevated PSA on a physical completed by Dr. Ethelene Hal in February 2023.  He was also noted to have back pain.  At that time his PSA was elevated at 3535.93.  Based on these findings, he was evaluated by Dr. Claudia Desanctis and underwent a prostate biopsy and staging scans.  The biopsy obtained on September 03, 2021 showed prostate adenocarcinoma Gleason score of 4+5 = 9 and 6 out of 12 cores.  He had a Gleason 8 in the remaining cores.  CT scan of the abdomen and pelvis showed bulky adenopathy with compression and to the right ureter.  He was noted to have prostatic megaly and marked distention of the right seminal vesicle.  Retroperitoneal, left iliac, and others showed area of metastatic disease.  Bone scan showed multifocal uptake in the pelvic, sacrum among other areas of bone involvement.  Based on these findings he was started on Firmagon under the care of Dr. Claudia Desanctis and referred to me for evaluation. ? ?Clinically, he feels remarkably well without any major complaints.  He reports his back pain has been intermittent and rarely takes hydrocodone.  Continues to work full-time without any decline in ability to do so.  He denies any weight loss or appetite changes.  He does report no urinary symptoms including hematuria, dysuria.  He denies any urinary frequency or nocturia. ? ?He does not report any headaches, blurry vision, syncope or seizures. Does not report any fevers, chills or sweats.  Does not report any cough, wheezing or hemoptysis.  Does not report any chest pain, palpitation, orthopnea or leg edema.  Does not report any nausea, vomiting or abdominal pain.  Does not report any constipation or diarrhea.  Does not report any  skeletal complaints.    Does not report frequency, urgency or hematuria.  Does not report any skin rashes or lesions. Does not report any heat or cold intolerance.  Does not report any lymphadenopathy or petechiae.  Does not report any anxiety or depression.  Remaining review of systems is negative.  ? ? ?Past medical history significant for borderline diabetes but no other comorbid conditions. ? ? ?Past Surgical History:  ?Procedure Laterality Date  ? EYE SURGERY    ?: ? ? ?Current Outpatient Medications:  ?  HYDROcodone-acetaminophen (NORCO) 5-325 MG tablet, Take 1 tablet by mouth every 6 (six) hours as needed for moderate pain., Disp: 60 tablet, Rfl: 0 ?  metFORMIN (GLUCOPHAGE XR) 500 MG 24 hr tablet, Take one nightly before bed., Disp: 90 tablet, Rfl: 2 ?  Multiple Vitamin (MULTIVITAMIN) capsule, Take 1 capsule by mouth daily. Multi vitamin with iron, Disp: , Rfl:  ?  pantoprazole (PROTONIX) 40 MG tablet, Take 1 tablet (40 mg total) by mouth daily., Disp: 90 tablet, Rfl: 0 ?  tamsulosin (FLOMAX) 0.4 MG CAPS capsule, Take 1 capsule (0.4 mg total) by mouth daily., Disp: 30 capsule, Rfl: 3: ? ?No Known Allergies: ? ? ?Family History  ?Family history unknown: Yes  ?: ? ? ?Social History  ? ?Socioeconomic History  ? Marital status: Married  ?  Spouse name: Not on file  ? Number of children: Not on file  ? Years of education: Not  on file  ? Highest education level: Not on file  ?Occupational History  ? Not on file  ?Tobacco Use  ? Smoking status: Never  ? Smokeless tobacco: Never  ?Vaping Use  ? Vaping Use: Never used  ?Substance and Sexual Activity  ? Alcohol use: Never  ? Drug use: Never  ? Sexual activity: Yes  ?Other Topics Concern  ? Not on file  ?Social History Narrative  ? Not on file  ? ?Social Determinants of Health  ? ?Financial Resource Strain: Not on file  ?Food Insecurity: Not on file  ?Transportation Needs: Not on file  ?Physical Activity: Not on file  ?Stress: Not on file  ?Social Connections: Not on  file  ?Intimate Partner Violence: Not on file  ?: ? ?Pertinent items are noted in HPI. ? ?Exam: ?Blood pressure 116/81, pulse 88, temperature 97.8 ?F (36.6 ?C), temperature source Axillary, resp. rate 18, height '5\' 8"'$  (1.727 m), weight 142 lb 6.4 oz (64.6 kg), SpO2 100 %. ?ECOG 1 ?General appearance: alert and cooperative appeared without distress. ?Head: atraumatic without any abnormalities. ?Eyes: conjunctivae/corneas clear. PERRL.  Sclera anicteric. ?Throat: lips, mucosa, and tongue normal; without oral thrush or ulcers. ?Resp: clear to auscultation bilaterally without rhonchi, wheezes or dullness to percussion. ?Cardio: regular rate and rhythm, S1, S2 normal, no murmur, click, rub or gallop ?GI: soft, non-tender; bowel sounds normal; no masses,  no organomegaly ?Skin: Skin color, texture, turgor normal. No rashes or lesions ?Lymph nodes: Cervical, supraclavicular, and axillary nodes normal. ?Neurologic: Grossly normal without any motor, sensory or deep tendon reflexes. ?Musculoskeletal: No joint deformity or effusion. ? ? ? ?NM Bone Scan Whole Body ? ?Result Date: 09/28/2021 ?CLINICAL DATA:  Prostate cancer, PSA 3,535, low back pain EXAM: NUCLEAR MEDICINE WHOLE BODY BONE SCAN TECHNIQUE: Whole body anterior and posterior images were obtained approximately 3 hours after intravenous injection of radiopharmaceutical. RADIOPHARMACEUTICALS:  21.8 mCi Technetium-68mMDP IV COMPARISON:  None. FINDINGS: There is focal uptake of radiotracer within the iliac spine, sacrum, and left ischium suspicious for osseous metastatic disease. Focal uptake within the lumbar spine is likely degenerative in nature. Focal uptake within the left seventh costovertebral junction is nonspecific and may be degenerative though isolated osseous metastasis in this location could appear similarly. Uptake within the shoulders and knees is degenerative in nature. There is right hydronephrosis noted. IMPRESSION: Multifocal uptake of radiotracer  within the pelvis and sacrum in keeping with osseous metastatic disease. Indeterminate focus within the left seventh costovertebral junction. Close attention on subsequent examination should help clarify the etiology of this lesion. Right hydronephrosis. Electronically Signed   By: AFidela SalisburyM.D.   On: 09/28/2021 00:16   ? ?Assessment and Plan:  ? ? ?71year old with: ? ?1.  Castration-sensitive advanced prostate cancer with disease to the bone and lymphadenopathy documented in March 2023.  He presented with a PSA of 3500 and a Gleason score of 9. ? ?The natural course of this disease was reviewed today and treatment choices were reiterated.  Androgen deprivation therapy remains the backbone to treat this disease and is already been started.  Therapy escalation at this time were discussed including Taxotere chemotherapy versus androgen receptor pathway inhibitors or combination of the above. ? ?Risks and benefits of Taxotere chemotherapy were discussed at this time.  Complications include nausea, vomiting, mild suppression, neutropenia and possible sepsis.  The benefit associated with overall survival has been improving on multiple occasions and he is agreeable to proceed.  The plan is to complete 6  cycles of chemotherapy utilizing Taxotere at 75 mg per metered square.  Chemotherapy education class will be arranged for in the near future.  He understands this treatment is palliative and his disease is incurable given the advanced nature. ? ?Addition of androgen receptor pathway inhibitors such as abiraterone or darolutamide could be considered as well given his high-volume high risk disease. ? ?2.  IV access: Risks and benefits of a Port-A-Cath insertion were reviewed.  Peripheral veins will be in use for the time being and the option is deferred. ? ?3.  Antiemetics: Prescription for Compazine will be made available to him. ? ?4.  Androgen deprivation therapy: He is currently on Firmagon and will be switched to  Eligard under the care of [facility]. ? ?5.  Bone directed therapy: I recommended calcium and vitamin D supplements and consideration for Xgeva in the future after obtaining dental clearance. ? ?6.  Follow-

## 2021-10-22 NOTE — Progress Notes (Signed)
Introduced myself to the patient as the prostate nurse navigator.  He is here to discuss his treatment options with Dr. Alen Blew. Patient will proceed with systemic therapy, Taxotere. RN provided education on next steps and what to expect with appointments.  I gave him my business card and asked him to call me with questions or concerns.  Verbalized understanding.  ?

## 2021-10-22 NOTE — Progress Notes (Signed)
START ON PATHWAY REGIMEN - Prostate ° ° °  Abiraterone/prednisone: A cycle is every 28 days: °    Abiraterone acetate (conventional)  °    Prednisone  °  Docetaxel cycles 1 through 6: A cycle is every 21 days: °    Docetaxel  °    [id number]-[id number]  ° °**Always confirm dose/schedule in your pharmacy ordering system** ° °Patient Characteristics: °Adenocarcinoma, Recurrent/New Systemic Disease (Including Biochemical Recurrence), Non-Castrate, M1 °Histology: Adenocarcinoma °Therapeutic Status: Recurrent/New Systemic Disease (Including Biochemical Recurrence) °Intent of Therapy: °Non-Curative / Palliative Intent, Discussed with Patient °

## 2021-10-26 ENCOUNTER — Telehealth: Payer: Self-pay | Admitting: Oncology

## 2021-10-26 NOTE — Telephone Encounter (Signed)
Scheduled per 04/14 los, patent has been called and notified of upcoming appointments. ?

## 2021-10-29 ENCOUNTER — Other Ambulatory Visit: Payer: Self-pay

## 2021-10-29 ENCOUNTER — Inpatient Hospital Stay: Payer: Medicare HMO

## 2021-10-29 DIAGNOSIS — C61 Malignant neoplasm of prostate: Secondary | ICD-10-CM

## 2021-10-30 NOTE — Progress Notes (Addendum)
Pharmacist Chemotherapy Monitoring - Initial Assessment   ? ?Anticipated start date: 11/05/21  ? ?The following has been reviewed per standard work regarding the patient's treatment regimen: ?The patient's diagnosis, treatment plan and drug doses, and organ/hematologic function ?Lab orders and baseline tests specific to treatment regimen  ?The treatment plan start date, drug sequencing, and pre-medications ?Prior authorization status  ?Patient's documented medication list, including drug-drug interaction screen and prescriptions for anti-emetics and supportive care specific to the treatment regimen ?The drug concentrations, fluid compatibility, administration routes, and timing of the medications to be used ?The patient's access for treatment and lifetime cumulative dose history, if applicable  ?The patient's medication allergies and previous infusion related reactions, if applicable  ? ?Changes made to treatment plan:  ?N/A ? ?Follow up needed:  ?1) labs on 11/05/21 ? ? ?Raul [place], [place], BCPS, BCOP ?10/30/2021  11:21 AM  ?

## 2021-11-04 ENCOUNTER — Ambulatory Visit: Payer: Medicare HMO

## 2021-11-04 ENCOUNTER — Other Ambulatory Visit: Payer: Medicare HMO

## 2021-11-05 ENCOUNTER — Inpatient Hospital Stay: Payer: Medicare HMO

## 2021-11-05 ENCOUNTER — Other Ambulatory Visit: Payer: Self-pay

## 2021-11-05 VITALS — BP 142/90 | HR 89 | Temp 98.4°F | Resp 20 | Wt 150.0 lb

## 2021-11-05 DIAGNOSIS — C7951 Secondary malignant neoplasm of bone: Secondary | ICD-10-CM | POA: Diagnosis not present

## 2021-11-05 DIAGNOSIS — C61 Malignant neoplasm of prostate: Secondary | ICD-10-CM

## 2021-11-05 DIAGNOSIS — Z5111 Encounter for antineoplastic chemotherapy: Secondary | ICD-10-CM | POA: Diagnosis not present

## 2021-11-05 LAB — CBC WITH DIFFERENTIAL (CANCER CENTER ONLY)
Abs Immature Granulocytes: 0.04 10*3/uL (ref 0.00–0.07)
Basophils Absolute: 0 10*3/uL (ref 0.0–0.1)
Basophils Relative: 1 %
Eosinophils Absolute: 0.1 10*3/uL (ref 0.0–0.5)
Eosinophils Relative: 3 %
HCT: 34.1 % — ABNORMAL LOW (ref 39.0–52.0)
Hemoglobin: 10.3 g/dL — ABNORMAL LOW (ref 13.0–17.0)
Immature Granulocytes: 1 %
Lymphocytes Relative: 32 %
Lymphs Abs: 1.3 10*3/uL (ref 0.7–4.0)
MCH: 22.3 pg — ABNORMAL LOW (ref 26.0–34.0)
MCHC: 30.2 g/dL (ref 30.0–36.0)
MCV: 74 fL — ABNORMAL LOW (ref 80.0–100.0)
Monocytes Absolute: 0.4 10*3/uL (ref 0.1–1.0)
Monocytes Relative: 11 %
Neutro Abs: 2.1 10*3/uL (ref 1.7–7.7)
Neutrophils Relative %: 52 %
Platelet Count: 333 10*3/uL (ref 150–400)
RBC: 4.61 MIL/uL (ref 4.22–5.81)
RDW: 17.2 % — ABNORMAL HIGH (ref 11.5–15.5)
WBC Count: 4 10*3/uL (ref 4.0–10.5)
nRBC: 0 % (ref 0.0–0.2)

## 2021-11-05 LAB — CMP (CANCER CENTER ONLY)
ALT: 33 U/L (ref 0–44)
AST: 24 U/L (ref 15–41)
Albumin: 3.8 g/dL (ref 3.5–5.0)
Alkaline Phosphatase: 126 U/L (ref 38–126)
Anion gap: 5 (ref 5–15)
BUN: 17 mg/dL (ref 8–23)
CO2: 30 mmol/L (ref 22–32)
Calcium: 9.4 mg/dL (ref 8.9–10.3)
Chloride: 104 mmol/L (ref 98–111)
Creatinine: 0.71 mg/dL (ref 0.61–1.24)
GFR, Estimated: >60 mL/min (ref >60)
Glucose, Bld: 89 mg/dL (ref 70–99)
Potassium: 4.2 mmol/L (ref 3.5–5.1)
Sodium: 139 mmol/L (ref 135–145)
Total Bilirubin: 0.5 mg/dL (ref 0.3–1.2)
Total Protein: 6.9 g/dL (ref 6.5–8.1)

## 2021-11-05 MED ORDER — SODIUM CHLORIDE 0.9 % IV SOLN: Freq: Once | INTRAVENOUS | Status: AC

## 2021-11-05 MED ORDER — SODIUM CHLORIDE 0.9 % IV SOLN
10.0000 mg | Freq: Once | INTRAVENOUS | Status: AC
  Administered 2021-11-05: 10 mg via INTRAVENOUS
  Filled 2021-11-05: qty 10

## 2021-11-05 MED ORDER — SODIUM CHLORIDE 0.9 % IV SOLN
75.0000 mg/m2 | Freq: Once | INTRAVENOUS | Status: AC
  Administered 2021-11-05: 130 mg via INTRAVENOUS
  Filled 2021-11-05: qty 13

## 2021-11-05 NOTE — Patient Instructions (Addendum)
Steven Jensen  Discharge Instructions: ?Thank you for choosing [facility] to provide your oncology and hematology care.  ? ?If you have a lab appointment with the [facility], please go directly to the [facility] and check in at the registration area. ?  ?Wear comfortable clothing and clothing appropriate for easy access to any Portacath or PICC line.  ? ?We strive to give you quality time with your provider. You may need to reschedule your appointment if you arrive late (15 or more minutes).  Arriving late affects you and other patients whose appointments are after yours.  Also, if you miss three or more appointments without notifying the office, you may be dismissed from the clinic at the provider?s discretion.    ?  ?For prescription refill requests, have your pharmacy contact our office and allow 72 hours for refills to be completed.   ? ?Today you received the following chemotherapy and/or immunotherapy agents: Docetaxel.     ?  ?To help prevent nausea and vomiting after your treatment, we encourage you to take your nausea medication as directed. ? ?BELOW ARE SYMPTOMS THAT SHOULD BE REPORTED IMMEDIATELY: ?*FEVER GREATER THAN 100.4 F (38 ?C) OR HIGHER ?*CHILLS OR SWEATING ?*NAUSEA AND VOMITING THAT IS NOT CONTROLLED WITH YOUR NAUSEA MEDICATION ?*UNUSUAL SHORTNESS OF BREATH ?*UNUSUAL BRUISING OR BLEEDING ?*URINARY PROBLEMS (pain or burning when urinating, or frequent urination) ?*BOWEL PROBLEMS (unusual diarrhea, constipation, pain near the anus) ?TENDERNESS IN MOUTH AND THROAT WITH OR WITHOUT PRESENCE OF ULCERS (sore throat, sores in mouth, or a toothache) ?UNUSUAL RASH, SWELLING OR PAIN  ?UNUSUAL VAGINAL DISCHARGE OR ITCHING  ? ?Items with * indicate a potential emergency and should be followed up as soon as possible or go to the Emergency Department if any problems should occur. ? ?Please show the CHEMOTHERAPY ALERT CARD or IMMUNOTHERAPY ALERT CARD at check-in  to the Emergency Department and triage nurse. ? ?Should you have questions after your visit or need to cancel or reschedule your appointment, please contact [facility]  Dept: [phone]  and follow the prompts.  Office hours are 8:00 a.m. to 4:30 p.m. Monday - Friday. Please note that voicemails left after 4:00 p.m. may not be returned until the following business day.  We are closed weekends and major holidays. You have access to a nurse at all times for urgent questions. Please call the main number to the clinic Dept: [phone] and follow the prompts. ? ? ?For any non-urgent questions, you may also contact your provider using MyChart. We now offer e-Visits for anyone 20 and older to request care online for non-urgent symptoms. For details visit [email or web address]. ?  ?Also download the MyChart app! Go to the app store, search "MyChart", open the app, select [facility], and log in with your MyChart username and password. ? ?Due to Covid, a mask is required upon entering the hospital/clinic. If you do not have a mask, one will be given to you upon arrival. For doctor visits, patients may have 1 support person aged 29 or older with them. For treatment visits, patients cannot have anyone with them due to current Covid guidelines and our immunocompromised population.  ? ?Docetaxel injection ?What is this medication? ?DOCETAXEL (doe se TAX el) is a chemotherapy drug. It targets fast dividing cells, like cancer cells, and causes these cells to die. This medicine is used to treat many types of cancers like breast cancer, certain stomach cancers, head and neck  cancer, lung cancer, and prostate cancer. ?This medicine may be used for other purposes; ask your health care provider or pharmacist if you have questions. ?COMMON BRAND NAME(S): Docefrez, Taxotere ?What should I tell my care team before I take this medication? ?They need to know if you have any of these  conditions: ?infection (especially a virus infection such as chickenpox, cold sores, or herpes) ?liver disease ?low blood counts, like low white cell, platelet, or red cell counts ?an unusual or allergic reaction to docetaxel, polysorbate 80, other chemotherapy agents, other medicines, foods, dyes, or preservatives ?pregnant or trying to get pregnant ?breast-feeding ?How should I use this medication? ?This drug is given as an infusion into a vein. It is administered in a hospital or clinic by a specially trained health care professional. ?Talk to your pediatrician regarding the use of this medicine in children. Special care may be needed. ?Overdosage: If you think you have taken too much of this medicine contact a poison control center or emergency room at once. ?NOTE: This medicine is only for you. Do not share this medicine with others. ?What if I miss a dose? ?It is important not to miss your dose. Call your doctor or health care professional if you are unable to keep an appointment. ?What may interact with this medication? ?Do not take this medicine with any of the following medications: ?live virus vaccines ?This medicine may also interact with the following medications: ?aprepitant ?certain antibiotics like erythromycin or clarithromycin ?certain antivirals for HIV or hepatitis ?certain medicines for fungal infections like fluconazole, itraconazole, ketoconazole, posaconazole, or voriconazole ?cimetidine ?ciprofloxacin ?conivaptan ?cyclosporine ?dronedarone ?fluvoxamine ?grapefruit juice ?imatinib ?verapamil ?This list may not describe all possible interactions. Give your health care provider a list of all the medicines, herbs, non-prescription drugs, or dietary supplements you use. Also tell them if you smoke, drink alcohol, or use illegal drugs. Some items may interact with your medicine. ?What should I watch for while using this medication? ?Your condition will be monitored carefully while you are receiving  this medicine. You will need important blood work done while you are taking this medicine. ?Call your doctor or health care professional for advice if you get a fever, chills or sore throat, or other symptoms of a cold or flu. Do not treat yourself. This drug decreases your body's ability to fight infections. Try to avoid being around people who are sick. ?Some products may contain alcohol. Ask your health care professional if this medicine contains alcohol. Be sure to tell all health care professionals you are taking this medicine. Certain medicines, like metronidazole and disulfiram, can cause an unpleasant reaction when taken with alcohol. The reaction includes flushing, headache, nausea, vomiting, sweating, and increased thirst. The reaction can last from 30 minutes to several hours. ?You may get drowsy or dizzy. Do not drive, use machinery, or do anything that needs mental alertness until you know how this medicine affects you. Do not stand or sit up quickly, especially if you are an older patient. This reduces the risk of dizzy or fainting spells. Alcohol may interfere with the effect of this medicine. ?Talk to your health care professional about your risk of cancer. You may be more at risk for certain types of cancer if you take this medicine. ?Do not become pregnant while taking this medicine or for 6 months after stopping it. Women should inform their doctor if they wish to become pregnant or think they might be pregnant. There is a potential for serious side effects  to an unborn child. Talk to your health care professional or pharmacist for more information. Do not breast-feed an infant while taking this medicine or for 1 week after stopping it. ?Males who get this medicine must use a condom during sex with females who can get pregnant. If you get a woman pregnant, the baby could have birth defects. The baby could die before they are born. You will need to continue wearing a condom for 3 months after  stopping the medicine. Tell your health care provider right away if your partner becomes pregnant while you are taking this medicine. ?This may interfere with the ability to father a child. You should talk to your doctor or he

## 2021-11-06 ENCOUNTER — Ambulatory Visit: Payer: Medicare HMO

## 2021-11-06 LAB — PROSTATE-SPECIFIC AG, SERUM (LABCORP): Prostate Specific Ag, Serum: 173 ng/mL — ABNORMAL HIGH (ref 0.0–4.0)

## 2021-11-08 ENCOUNTER — Inpatient Hospital Stay: Payer: Medicare HMO | Attending: Oncology

## 2021-11-08 ENCOUNTER — Other Ambulatory Visit: Payer: Self-pay

## 2021-11-08 ENCOUNTER — Telehealth: Payer: Self-pay

## 2021-11-08 VITALS — BP 121/83 | HR 100 | Temp 98.0°F | Resp 18

## 2021-11-08 DIAGNOSIS — Z5189 Encounter for other specified aftercare: Secondary | ICD-10-CM | POA: Diagnosis not present

## 2021-11-08 DIAGNOSIS — C61 Malignant neoplasm of prostate: Secondary | ICD-10-CM | POA: Diagnosis not present

## 2021-11-08 MED ORDER — PEGFILGRASTIM-JMDB 6 MG/0.6ML ~~LOC~~ SOSY
6.0000 mg | PREFILLED_SYRINGE | Freq: Once | SUBCUTANEOUS | Status: AC
  Administered 2021-11-08: 6 mg via SUBCUTANEOUS
  Filled 2021-11-08: qty 0.6

## 2021-11-08 NOTE — Telephone Encounter (Signed)
Mr Dematteo wife Pura Spice states that Mr. Bourdon is doing fine.  ?He is eating, drinking, and urinating well. He is actually at work this morning. ?They know to call the office at [phone] if they have any questions or concerns. ?

## 2021-11-08 NOTE — Telephone Encounter (Signed)
-----   Message from Rafael Bihari, RN sent at 11/05/2021  1:07 PM EDT ----- ?Regarding: Dr Alen Blew, first time Docetaxel. ?Dr Alen Blew pt came in 4/28 for first time Docetaxel. Iced hands and feet. Tolerated infusion well. Needs call back.  ? ?

## 2021-11-17 DIAGNOSIS — C7951 Secondary malignant neoplasm of bone: Secondary | ICD-10-CM | POA: Diagnosis not present

## 2021-11-17 DIAGNOSIS — C61 Malignant neoplasm of prostate: Secondary | ICD-10-CM | POA: Diagnosis not present

## 2021-11-17 DIAGNOSIS — C778 Secondary and unspecified malignant neoplasm of lymph nodes of multiple regions: Secondary | ICD-10-CM | POA: Diagnosis not present

## 2021-11-25 MED FILL — Dexamethasone Sodium Phosphate Inj 100 MG/10ML: INTRAMUSCULAR | Qty: 1 | Status: AC

## 2021-11-26 ENCOUNTER — Emergency Department (HOSPITAL_COMMUNITY): Payer: Medicare HMO

## 2021-11-26 ENCOUNTER — Inpatient Hospital Stay (HOSPITAL_BASED_OUTPATIENT_CLINIC_OR_DEPARTMENT_OTHER): Payer: Medicare HMO | Admitting: Physician Assistant

## 2021-11-26 ENCOUNTER — Encounter (HOSPITAL_COMMUNITY): Payer: Self-pay | Admitting: Emergency Medicine

## 2021-11-26 ENCOUNTER — Other Ambulatory Visit: Payer: Self-pay

## 2021-11-26 ENCOUNTER — Emergency Department (HOSPITAL_COMMUNITY)
Admission: EM | Admit: 2021-11-26 | Discharge: 2021-11-26 | Disposition: A | Discharge status: Home / Self Care | Payer: Medicare HMO | Attending: Emergency Medicine | Admitting: Emergency Medicine

## 2021-11-26 ENCOUNTER — Inpatient Hospital Stay: Payer: Medicare HMO | Admitting: Oncology

## 2021-11-26 ENCOUNTER — Inpatient Hospital Stay: Payer: Medicare HMO

## 2021-11-26 VITALS — BP 131/82 | HR 92 | Temp 97.6°F | Resp 16 | Ht 68.0 in | Wt 156.4 lb

## 2021-11-26 VITALS — BP 139/82 | HR 86 | Resp 16

## 2021-11-26 DIAGNOSIS — D72829 Elevated white blood cell count, unspecified: Secondary | ICD-10-CM | POA: Insufficient documentation

## 2021-11-26 DIAGNOSIS — C61 Malignant neoplasm of prostate: Secondary | ICD-10-CM

## 2021-11-26 DIAGNOSIS — Z8546 Personal history of malignant neoplasm of prostate: Secondary | ICD-10-CM | POA: Diagnosis not present

## 2021-11-26 DIAGNOSIS — R569 Unspecified convulsions: Secondary | ICD-10-CM | POA: Diagnosis not present

## 2021-11-26 DIAGNOSIS — R9431 Abnormal electrocardiogram [ECG] [EKG]: Secondary | ICD-10-CM | POA: Diagnosis not present

## 2021-11-26 HISTORY — DX: Malignant (primary) neoplasm, unspecified: C80.1

## 2021-11-26 LAB — COMPREHENSIVE METABOLIC PANEL
ALT: 23 U/L (ref 0–44)
AST: 22 U/L (ref 15–41)
Albumin: 3.5 g/dL (ref 3.5–5.0)
Alkaline Phosphatase: 104 U/L (ref 38–126)
Anion gap: 4 — ABNORMAL LOW (ref 5–15)
BUN: 17 mg/dL (ref 8–23)
CO2: 28 mmol/L (ref 22–32)
Calcium: 9.2 mg/dL (ref 8.9–10.3)
Chloride: 108 mmol/L (ref 98–111)
Creatinine, Ser: 0.7 mg/dL (ref 0.61–1.24)
GFR, Estimated: >60 mL/min (ref >60)
Glucose, Bld: 113 mg/dL — ABNORMAL HIGH (ref 70–99)
Potassium: 4.3 mmol/L (ref 3.5–5.1)
Sodium: 140 mmol/L (ref 135–145)
Total Bilirubin: 0.6 mg/dL (ref 0.3–1.2)
Total Protein: 7.1 g/dL (ref 6.5–8.1)

## 2021-11-26 LAB — CBC WITH DIFFERENTIAL (CANCER CENTER ONLY)
Abs Immature Granulocytes: 0.06 10*3/uL (ref 0.00–0.07)
Basophils Absolute: 0.1 10*3/uL (ref 0.0–0.1)
Basophils Relative: 1 %
Eosinophils Absolute: 0 10*3/uL (ref 0.0–0.5)
Eosinophils Relative: 0 %
HCT: 32 % — ABNORMAL LOW (ref 39.0–52.0)
Hemoglobin: 9.8 g/dL — ABNORMAL LOW (ref 13.0–17.0)
Immature Granulocytes: 1 %
Lymphocytes Relative: 27 %
Lymphs Abs: 1.4 10*3/uL (ref 0.7–4.0)
MCH: 22.8 pg — ABNORMAL LOW (ref 26.0–34.0)
MCHC: 30.6 g/dL (ref 30.0–36.0)
MCV: 74.6 fL — ABNORMAL LOW (ref 80.0–100.0)
Monocytes Absolute: 0.6 10*3/uL (ref 0.1–1.0)
Monocytes Relative: 12 %
Neutro Abs: 3 10*3/uL (ref 1.7–7.7)
Neutrophils Relative %: 59 %
Platelet Count: 578 10*3/uL — ABNORMAL HIGH (ref 150–400)
RBC: 4.29 MIL/uL (ref 4.22–5.81)
RDW: 17.2 % — ABNORMAL HIGH (ref 11.5–15.5)
WBC Count: 5.1 10*3/uL (ref 4.0–10.5)
nRBC: 0 % (ref 0.0–0.2)

## 2021-11-26 LAB — CBC WITH DIFFERENTIAL/PLATELET
Abs Immature Granulocytes: 0.25 10*3/uL — ABNORMAL HIGH (ref 0.00–0.07)
Basophils Absolute: 0.1 10*3/uL (ref 0.0–0.1)
Basophils Relative: 0 %
Eosinophils Absolute: 0 10*3/uL (ref 0.0–0.5)
Eosinophils Relative: 0 %
HCT: 33 % — ABNORMAL LOW (ref 39.0–52.0)
Hemoglobin: 9.8 g/dL — ABNORMAL LOW (ref 13.0–17.0)
Immature Granulocytes: 2 %
Lymphocytes Relative: 3 %
Lymphs Abs: 0.5 10*3/uL — ABNORMAL LOW (ref 0.7–4.0)
MCH: 22.7 pg — ABNORMAL LOW (ref 26.0–34.0)
MCHC: 29.7 g/dL — ABNORMAL LOW (ref 30.0–36.0)
MCV: 76.6 fL — ABNORMAL LOW (ref 80.0–100.0)
Monocytes Absolute: 0.2 10*3/uL (ref 0.1–1.0)
Monocytes Relative: 1 %
Neutro Abs: 12.9 10*3/uL — ABNORMAL HIGH (ref 1.7–7.7)
Neutrophils Relative %: 94 %
Platelets: 526 10*3/uL — ABNORMAL HIGH (ref 150–400)
RBC: 4.31 MIL/uL (ref 4.22–5.81)
RDW: 17.2 % — ABNORMAL HIGH (ref 11.5–15.5)
WBC: 13.8 10*3/uL — ABNORMAL HIGH (ref 4.0–10.5)
nRBC: 0 % (ref 0.0–0.2)

## 2021-11-26 LAB — CMP (CANCER CENTER ONLY)
ALT: 21 U/L (ref 0–44)
AST: 20 U/L (ref 15–41)
Albumin: 3.7 g/dL (ref 3.5–5.0)
Alkaline Phosphatase: 115 U/L (ref 38–126)
Anion gap: 4 — ABNORMAL LOW (ref 5–15)
BUN: 15 mg/dL (ref 8–23)
CO2: 29 mmol/L (ref 22–32)
Calcium: 9.5 mg/dL (ref 8.9–10.3)
Chloride: 105 mmol/L (ref 98–111)
Creatinine: 0.75 mg/dL (ref 0.61–1.24)
GFR, Estimated: >60 mL/min (ref >60)
Glucose, Bld: 97 mg/dL (ref 70–99)
Potassium: 4.2 mmol/L (ref 3.5–5.1)
Sodium: 138 mmol/L (ref 135–145)
Total Bilirubin: 0.2 mg/dL — ABNORMAL LOW (ref 0.3–1.2)
Total Protein: 7.1 g/dL (ref 6.5–8.1)

## 2021-11-26 LAB — MAGNESIUM: Magnesium: 2 mg/dL (ref 1.7–2.4)

## 2021-11-26 LAB — URINALYSIS, ROUTINE W REFLEX MICROSCOPIC
Bacteria, UA: NONE SEEN
Bilirubin Urine: NEGATIVE
Glucose, UA: NEGATIVE mg/dL
Hgb urine dipstick: NEGATIVE
Ketones, ur: NEGATIVE mg/dL
Leukocytes,Ua: NEGATIVE
Nitrite: NEGATIVE
Protein, ur: 30 mg/dL — AB
Specific Gravity, Urine: 1.016 (ref 1.005–1.030)
pH: 7 (ref 5.0–8.0)

## 2021-11-26 LAB — CBG MONITORING, ED: Glucose-Capillary: 106 mg/dL — ABNORMAL HIGH (ref 70–99)

## 2021-11-26 MED ORDER — SODIUM CHLORIDE 0.9 % IV BOLUS
1000.0000 mL | Freq: Once | INTRAVENOUS | Status: AC
  Administered 2021-11-26: 1000 mL via INTRAVENOUS

## 2021-11-26 MED ORDER — SODIUM CHLORIDE 0.9 % IV SOLN
75.0000 mg/m2 | Freq: Once | INTRAVENOUS | Status: AC
  Administered 2021-11-26: 130 mg via INTRAVENOUS
  Filled 2021-11-26: qty 13

## 2021-11-26 MED ORDER — SODIUM CHLORIDE 0.9 % IV SOLN: INTRAVENOUS | Status: DC

## 2021-11-26 MED ORDER — SODIUM CHLORIDE 0.9 % IV SOLN: Freq: Once | INTRAVENOUS | Status: AC

## 2021-11-26 MED ORDER — GADOBUTROL 1 MMOL/ML IV SOLN
7.0000 mL | Freq: Once | INTRAVENOUS | Status: AC | PRN
  Administered 2021-11-26: 7 mL via INTRAVENOUS

## 2021-11-26 MED ORDER — SODIUM CHLORIDE 0.9 % IV SOLN
10.0000 mg | Freq: Once | INTRAVENOUS | Status: AC
  Administered 2021-11-26: 10 mg via INTRAVENOUS
  Filled 2021-11-26: qty 10

## 2021-11-26 NOTE — ED Provider Notes (Signed)
[facility] DEPT Provider Note   CSN: [id number] Arrival date & time: 11/26/21  1451     History  Chief Complaint  Patient presents with   Seizures    Steven Jensen is a 71 y.o. male.  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  He was very recently diagnosed.  He had an elevated PSA in Feb and had a biopsy in Feb.  He had a PET scan in March which showed osseous metastatic disease within the pelvis and sacrum.  He had androgen deprivation therapy (Eligard) in May.  He started Taxotere chemo on 4/28.  He was at the cancer center for his 2nd infusion on Taxotere when he had an episode of unresponsiveness after 5 min of treatment.  Pt had full body stiffening and was incontinent of urine.  Pt said he's been feeling well, just a little weak.  He denies headache, n/v, or any vision changes or weakness.      Home Medications Prior to Admission medications   Medication Sig Start Date End Date Taking? Authorizing Provider  HYDROcodone-acetaminophen (NORCO) 5-325 MG tablet Take 1 tablet by mouth every 6 (six) hours as needed for moderate pain. 08/30/21   Libby Maw, MD  metFORMIN (GLUCOPHAGE XR) 500 MG 24 hr tablet Take one nightly before bed. 09/17/21   Libby Maw, MD  Multiple Vitamin (MULTIVITAMIN) capsule Take 1 capsule by mouth daily. Multi vitamin with iron    [provider]  pantoprazole (PROTONIX) 40 MG tablet Take 1 tablet (40 mg total) by mouth daily. 09/17/21   Libby Maw, MD  prochlorperazine (COMPAZINE) 10 MG tablet Take 1 tablet (10 mg total) by mouth every 6 (six) hours as needed for nausea or vomiting. 10/22/21   Wyatt Portela, MD  tamsulosin (FLOMAX) 0.4 MG CAPS capsule Take 1 capsule (0.4 mg total) by mouth daily. 08/13/21   Libby Maw, MD      Allergies    Patient has no known allergies.    Review of  Systems   Review of Systems  Neurological:  Positive for seizures.  All other systems reviewed and are negative.  Physical Exam Updated Vital Signs BP (!) 192/105   Pulse 85   Temp 97.7 F (36.5 C)   Resp 13   SpO2 100%  Physical Exam Vitals and nursing note reviewed.  Constitutional:      Appearance: Normal appearance.  HENT:     Head: Normocephalic and atraumatic.     Right Ear: External ear normal.     Left Ear: External ear normal.     Nose: Nose normal.     Mouth/Throat:     Mouth: Mucous membranes are moist.     Pharynx: Oropharynx is clear.  Eyes:     Extraocular Movements: Extraocular movements intact.     Conjunctiva/sclera: Conjunctivae normal.     Pupils: Pupils are equal, round, and reactive to light.  Cardiovascular:     Rate and Rhythm: Normal rate and regular rhythm.     Pulses: Normal pulses.     Heart sounds: Normal heart sounds.  Pulmonary:     Effort: Pulmonary effort is normal.     Breath sounds: Normal breath sounds.  Abdominal:     General: Abdomen is flat. Bowel sounds are normal.     Palpations: Abdomen is soft.  Musculoskeletal:  General: Normal range of motion.     Cervical back: Normal range of motion and neck supple.  Skin:    General: Skin is warm.     Capillary Refill: Capillary refill takes less than 2 seconds.  Neurological:     General: No focal deficit present.     Mental Status: He is alert and oriented to person, place, and time.  Psychiatric:        Mood and Affect: Mood normal.        Behavior: Behavior normal.    ED Results / Procedures / Treatments   Labs (all labs ordered are listed, but only abnormal results are displayed) Labs Reviewed  COMPREHENSIVE METABOLIC PANEL - Abnormal; Notable for the following components:      Result Value   Glucose, Bld 113 (*)    Anion gap 4 (*)    All other components within normal limits  CBC WITH DIFFERENTIAL/PLATELET - Abnormal; Notable for the following components:   WBC  13.8 (*)    Hemoglobin 9.8 (*)    HCT 33.0 (*)    MCV 76.6 (*)    MCH 22.7 (*)    MCHC 29.7 (*)    RDW 17.2 (*)    Platelets 526 (*)    Neutro Abs 12.9 (*)    Lymphs Abs 0.5 (*)    Abs Immature Granulocytes 0.25 (*)    All other components within normal limits  URINALYSIS, ROUTINE W REFLEX MICROSCOPIC - Abnormal; Notable for the following components:   Protein, ur 30 (*)    All other components within normal limits  CBG MONITORING, ED - Abnormal; Notable for the following components:   Glucose-Capillary 106 (*)    All other components within normal limits  MAGNESIUM    EKG EKG Interpretation  Date/Time:  Friday Nov 26 2021 15:02:27 EDT Ventricular Rate:  89 PR Interval:  161 QRS Duration: 83 QT Interval:  361 QTC Calculation: 440 R Axis:   73 Text Interpretation: Sinus rhythm Probable left atrial enlargement Left ventricular hypertrophy ST elevation, consider anterior injury No old tracing to compare Confirmed by Isla Pence [phone]) on 11/26/2021 3:25:16 PM  Radiology CT HEAD WO CONTRAST  Result Date: 11/26/2021 CLINICAL DATA:  New onset seizure activity EXAM: CT HEAD WITHOUT CONTRAST TECHNIQUE: Contiguous axial images were obtained from the base of the skull through the vertex without intravenous contrast. RADIATION DOSE REDUCTION: This exam was performed according to the departmental dose-optimization program which includes automated exposure control, adjustment of the mA and/or kV according to patient size and/or use of iterative reconstruction technique. COMPARISON:  None Available. FINDINGS: Brain: No evidence of acute infarction, hemorrhage, hydrocephalus, extra-axial collection or mass lesion/mass effect. Vascular: No hyperdense vessel or unexpected calcification. Skull: Normal. Negative for fracture or focal lesion. Sinuses/Orbits: Mild mucosal thickening is noted within the maxillary antra bilaterally. Orbits and their contents show postsurgical changes in the left  globe. Other: None IMPRESSION: No acute intracranial abnormality noted. Mild mucosal thickening in the paranasal sinuses. Electronically Signed   By: Inez Catalina M.D.   On: 11/26/2021 17:32   MR BRAIN W WO CONTRAST  Result Date: 11/26/2021 CLINICAL DATA:  Possible seizure, history of prostate cancer EXAM: MRI HEAD WITHOUT AND WITH CONTRAST TECHNIQUE: Multiplanar, multiecho pulse sequences of the brain and surrounding structures were obtained without and with intravenous contrast. CONTRAST:  11m GADAVIST GADOBUTROL 1 MMOL/ML IV SOLN COMPARISON:  None Available. FINDINGS: Motion artifact is present. Brain: There is no acute infarction or intracranial  hemorrhage. There is no intracranial mass, mass effect, or edema. There is no hydrocephalus or extra-axial fluid collection. Prominence of the ventricles and sulci reflects mild parenchymal volume loss. Punctate focus of susceptibility in the right cerebellum likely reflects a chronic microhemorrhage. No abnormal enhancement. Vascular: Major vessel flow voids at the skull base are preserved. Skull and upper cervical spine: Marrow signal is within normal limits. Sinuses/Orbits: Mild mucosal thickening.  Orbits are unremarkable. Other: Sella is unremarkable.  Mastoid air cells are clear. IMPRESSION: No evidence of recent infarction, hemorrhage, or mass. No abnormal enhancement. Electronically Signed   By: Macy Mis M.D.   On: 11/26/2021 19:37   DG Chest Port 1 View  Result Date: 11/26/2021 CLINICAL DATA:  Seizure. EXAM: PORTABLE CHEST 1 VIEW COMPARISON:  None Available. FINDINGS: The heart size and mediastinal contours are within normal limits. Several calcifications are seen in the right hilar and midlung region most consistent with calcified granulomas. No acute pulmonary abnormality is noted. The visualized skeletal structures are unremarkable. IMPRESSION: No active disease. Electronically Signed   By: Marijo Conception M.D.   On: 11/26/2021 15:26     Procedures Procedures    Medications Ordered in ED Medications  sodium chloride 0.9 % bolus 1,000 mL (0 mLs Intravenous Stopped 11/26/21 1903)    And  0.9 %  sodium chloride infusion (has no administration in time range)  gadobutrol (GADAVIST) 1 MMOL/ML injection 7 mL (7 mLs Intravenous Contrast Given 11/26/21 1826)    ED Course/ Medical Decision Making/ A&P                           Medical Decision Making Amount and/or Complexity of Data Reviewed Labs: ordered. Radiology: ordered.  Risk Prescription drug management.   This patient presents to the ED for concern of seizure, this involves an extensive number of treatment options, and is a complaint that carries with it a high risk of complications and morbidity.  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  The pertinent results include:  wbc elevated at 13.8, hgb 9.8; cmp nl; mg nl; cbg 106; ua neg   Imaging Studies ordered:  I ordered imaging studies including CXR, CT head, MRI brain  I independently visualized and interpreted imaging which showed  CXR:   IMPRESSION:  No active disease.  CT head:   IMPRESSION:  No acute intracranial abnormality noted.     Mild mucosal thickening in the paranasal sinuses.   I agree with the radiologist interpretation   Cardiac Monitoring:  The patient was maintained on a cardiac monitor.  I personally viewed and interpreted the cardiac monitored which showed an underlying rhythm of: nsr   Medicines ordered and prescription drug management:  I ordered medication including ivfs  for dehydration  Reevaluation of the patient after these medicines showed that the patient  improved I have reviewed the patients home medicines and have made adjustments as needed   Test Considered:  mri   Critical Interventions:  mri   Consultations Obtained:  I requested consultation with the neurologist (Dr. Leonel Ramsay),  and discussed lab and imaging findings as well as pertinent plan - he recommends MRI with and without contrast.  If nl, he can go home with outpatient f/u.   Problem List / ED Course:  Seizure:  no evidence of brain met or electrolyte abn.  ? Due to the chemo.  Pt has been observed here for several hrs without any signs of abnormality.  Pt to f/u with neuro as an outpatient.  Return if worse.    Reevaluation:  After the interventions noted above, I reevaluated the patient and found that they have :improved   Social Determinants of Health:  Lives at home with his wife   Dispostion:  After consideration of the diagnostic results and the patients response to treatment, I feel that the patent would benefit from discharge with outpatient f/u.          Final Clinical Impression(s) / ED Diagnoses Final diagnoses:  Seizure-like activity ([place])    Rx / DC Orders ED Discharge Orders          Ordered    Ambulatory referral to Neurology       Comments: An appointment is requested in approximately: 1 week   11/26/21 1950              Isla Pence, MD 11/26/21 2044

## 2021-11-26 NOTE — ED Notes (Signed)
Advised pt we need urine. Urinal at bedside.

## 2021-11-26 NOTE — Progress Notes (Signed)
Called to infusion center by charge RN as patient unresponsive. He has history of prostate cancer and approximately 5 minutes into his Taxotere he went unresponsive. RN heard snoring respirations and saw full body stiffening. Treatment was paused and IVF started. He was sternal rubbed and regained consciousness. Glucose checked and was 103. He was diaphoretic and hypertensive 188/122, alert and oriented. He was found to be incontinent of urine. No history of seizures, denies alcohol consumption. No medications given, did not appear post ictal. Discussed patient with oncologist Dr. Alen Blew who agrees with plan for ED evaluation for first time seizure. Patient transport by myself and NT in stable condition.

## 2021-11-26 NOTE — ED Notes (Signed)
Patient transported to MRI 

## 2021-11-26 NOTE — Progress Notes (Signed)
Went to do nursing admission hx on pt. Pt's rn told me that pt is being discharged. Lucius Conn BSN, RN-BC Admissions RN 11/26/2021 7:54 PM

## 2021-11-26 NOTE — Progress Notes (Signed)
CBG 103.  Anda Kraft, [place] aware and at bedside

## 2021-11-26 NOTE — Progress Notes (Signed)
Hematology and Oncology Follow Up Visit  Steven Jensen [id number] 1951/06/23 71 y.o. 11/26/2021 11:26 AM Steven Jensen, MDKremer, Steven Jensen,*   Principle Diagnosis: 71 year old man with castration-sensitive advanced prostate cancer with disease to the bone and lymphadenopathy diagnosed in March 2023.  He presented with a Gleason score of 9 and a PSA of 3500.   Prior Therapy: He is status post prostate biopsy completed in February 2023 which confirmed the presence of Gleason score 9 prostate cancer in 6 of all 12 cores   Current therapy:   Androgen deprivation therapy under the care of Dr. Claudia Desanctis.  He received Eligard 45 mg in May 2023.  Taxotere chemotherapy started November 05, 2021 with 75 mg per metered square.  He is here for cycle 2 of therapy.  Interim History: Steven Jensen returns today for a follow-up visit.  Since last visit, he received the first cycle of chemotherapy without any major complications.  He denies any nausea, vomiting or abdominal pain.  He denies any hospitalizations or illnesses.     Medications: I have reviewed the patient's current medications.  Current Outpatient Medications  Medication Sig Dispense Refill   HYDROcodone-acetaminophen (NORCO) 5-325 MG tablet Take 1 tablet by mouth every 6 (six) hours as needed for moderate pain. 60 tablet 0   metFORMIN (GLUCOPHAGE XR) 500 MG 24 hr tablet Take one nightly before bed. 90 tablet 2   Multiple Vitamin (MULTIVITAMIN) capsule Take 1 capsule by mouth daily. Multi vitamin with iron     pantoprazole (PROTONIX) 40 MG tablet Take 1 tablet (40 mg total) by mouth daily. 90 tablet 0   prochlorperazine (COMPAZINE) 10 MG tablet Take 1 tablet (10 mg total) by mouth every 6 (six) hours as needed for nausea or vomiting. 30 tablet 0   tamsulosin (FLOMAX) 0.4 MG CAPS capsule Take 1 capsule (0.4 mg total) by mouth daily. 30 capsule 3   No current facility-administered medications for this visit.     Allergies: No  Known Allergies    Physical Exam: Blood pressure 131/82, pulse 92, temperature 97.6 F (36.4 C), temperature source Temporal, resp. rate 16, height '5\' 8"'$  (1.727 m), weight 156 lb 6.4 oz (70.9 kg), SpO2 100 %.  ECOG: 1   General appearance: Comfortable appearing without any discomfort Head: Normocephalic without any trauma Oropharynx: Mucous membranes are moist and pink without any thrush or ulcers. Eyes: Pupils are equal and round reactive to light. Lymph nodes: No cervical, supraclavicular, inguinal or axillary lymphadenopathy.   Heart:regular rate and rhythm.  S1 and S2 without leg edema. Lung: Clear without any rhonchi or wheezes.  No dullness to percussion. Abdomin: Soft, nontender, nondistended with good bowel sounds.  No hepatosplenomegaly. Musculoskeletal: No joint deformity or effusion.  Full range of motion noted. Neurological: No deficits noted on motor, sensory and deep tendon reflex exam. Skin: No petechial rash or dryness.  Appeared moist.     Lab Results: Lab Results  Component Value Date   WBC 4.0 11/05/2021   HGB 10.3 (L) 11/05/2021   HCT 34.1 (L) 11/05/2021   MCV 74.0 (L) 11/05/2021   PLT 333 11/05/2021     Chemistry      Component Value Date/Time   NA 139 11/05/2021 0912   K 4.2 11/05/2021 0912   CL 104 11/05/2021 0912   CO2 30 11/05/2021 0912   BUN 17 11/05/2021 0912   CREATININE 0.71 11/05/2021 0912   CREATININE 0.76 08/13/2021 1519      Component Value Date/Time   CALCIUM  9.4 11/05/2021 0912   ALKPHOS 126 11/05/2021 0912   AST 24 11/05/2021 0912   ALT 33 11/05/2021 0912   BILITOT 0.5 11/05/2021 0912         Impression and Plan:   71 year old with:  1.  Advanced prostate cancer with disease to the bone and lymphadenopathy diagnosed in March 2023.  He has castration-sensitive disease.   He is currently on Taxotere chemotherapy in addition to androgen deprivation without any major complications.  Risks and benefits of continuing this  treatment were discussed at this time.  Potential complications including nausea, vomiting, myelosuppression as well as neutropenia and sepsis were reiterated.  Therapy escalation with addition of abiraterone or darolutamide will also discussed.  At this time we elected to proceed with chemotherapy alone considering his tolerance.   2.  IV access: Peripheral veins are currently in use.  Risks and benefits of a Port-A-Cath insertion was also discussed.  He had continues to opt with peripheral veins at this time.   3.  Antiemetics: Compazine is available to him without any nausea or vomiting.   4.  Androgen deprivation therapy: He will continue to receive Eligard under the care of [facility].  Next Eligard injection will be in November 2023.   5.  Bone directed therapy: Delton See will be considered after obtaining dental clearance.  I recommended calcium and vitamin D supplements.   6.  Follow-up: 3 weeks for repeat evaluation and next cycle of therapy.   30  minutes were spent on this encounter.  The time was dedicated to reviewing laboratory, disease status update and addressing complications noted to cancer and cancer therapy.     Zola Button, MD 5/19/202311:26 AM

## 2021-11-26 NOTE — Patient Instructions (Signed)
[facility]  Discharge Instructions: Thank you for choosing [facility] to provide your oncology and hematology care.   If you have a lab appointment with the [facility], please go directly to the [facility] and check in at the registration area.   Wear comfortable clothing and clothing appropriate for easy access to any Portacath or PICC line.   We strive to give you quality time with your provider. You may need to reschedule your appointment if you arrive late (15 or more minutes).  Arriving late affects you and other patients whose appointments are after yours.  Also, if you miss three or more appointments without notifying the office, you may be dismissed from the clinic at the provider's discretion.      For prescription refill requests, have your pharmacy contact our office and allow 72 hours for refills to be completed.    Today you received the following chemotherapy and/or immunotherapy agents: Docetaxel.       To help prevent nausea and vomiting after your treatment, we encourage you to take your nausea medication as directed.  BELOW ARE SYMPTOMS THAT SHOULD BE REPORTED IMMEDIATELY: *FEVER GREATER THAN 100.4 F (38 C) OR HIGHER *CHILLS OR SWEATING *NAUSEA AND VOMITING THAT IS NOT CONTROLLED WITH YOUR NAUSEA MEDICATION *UNUSUAL SHORTNESS OF BREATH *UNUSUAL BRUISING OR BLEEDING *URINARY PROBLEMS (pain or burning when urinating, or frequent urination) *BOWEL PROBLEMS (unusual diarrhea, constipation, pain near the anus) TENDERNESS IN MOUTH AND THROAT WITH OR WITHOUT PRESENCE OF ULCERS (sore throat, sores in mouth, or a toothache) UNUSUAL RASH, SWELLING OR PAIN  UNUSUAL VAGINAL DISCHARGE OR ITCHING   Items with * indicate a potential emergency and should be followed up as soon as possible or go to the Emergency Department if any problems should occur.  Please show the CHEMOTHERAPY ALERT CARD or IMMUNOTHERAPY ALERT CARD at check-in  to the Emergency Department and triage nurse.  Should you have questions after your visit or need to cancel or reschedule your appointment, please contact [facility]  Dept: [phone]  and follow the prompts.  Office hours are 8:00 a.m. to 4:30 p.m. Monday - Friday. Please note that voicemails left after 4:00 p.m. may not be returned until the following business day.  We are closed weekends and major holidays. You have access to a nurse at all times for urgent questions. Please call the main number to the clinic Dept: [phone] and follow the prompts.   For any non-urgent questions, you may also contact your provider using MyChart. We now offer e-Visits for anyone 54 and older to request care online for non-urgent symptoms. For details visit [email or web address].   Also download the MyChart app! Go to the app store, search "MyChart", open the app, select [facility], and log in with your MyChart username and password.  Due to Covid, a mask is required upon entering the hospital/clinic. If you do not have a mask, one will be given to you upon arrival. For doctor visits, patients may have 1 support person aged 2 or older with them. For treatment visits, patients cannot have anyone with them due to current Covid guidelines and our immunocompromised population.   Docetaxel injection What is this medication? DOCETAXEL (doe se TAX el) is a chemotherapy drug. It targets fast dividing cells, like cancer cells, and causes these cells to die. This medicine is used to treat many types of cancers like breast cancer, certain stomach cancers, head and neck  cancer, lung cancer, and prostate cancer. This medicine may be used for other purposes; ask your health care provider or pharmacist if you have questions. COMMON BRAND NAME(S): Docefrez, Taxotere What should I tell my care team before I take this medication? They need to know if you have any of these  conditions: infection (especially a virus infection such as chickenpox, cold sores, or herpes) liver disease low blood counts, like low white cell, platelet, or red cell counts an unusual or allergic reaction to docetaxel, polysorbate 80, other chemotherapy agents, other medicines, foods, dyes, or preservatives pregnant or trying to get pregnant breast-feeding How should I use this medication? This drug is given as an infusion into a vein. It is administered in a hospital or clinic by a specially trained health care professional. Talk to your pediatrician regarding the use of this medicine in children. Special care may be needed. Overdosage: If you think you have taken too much of this medicine contact a poison control center or emergency room at once. NOTE: This medicine is only for you. Do not share this medicine with others. What if I miss a dose? It is important not to miss your dose. Call your doctor or health care professional if you are unable to keep an appointment. What may interact with this medication? Do not take this medicine with any of the following medications: live virus vaccines This medicine may also interact with the following medications: aprepitant certain antibiotics like erythromycin or clarithromycin certain antivirals for HIV or hepatitis certain medicines for fungal infections like fluconazole, itraconazole, ketoconazole, posaconazole, or voriconazole cimetidine ciprofloxacin conivaptan cyclosporine dronedarone fluvoxamine grapefruit juice imatinib verapamil This list may not describe all possible interactions. Give your health care provider a list of all the medicines, herbs, non-prescription drugs, or dietary supplements you use. Also tell them if you smoke, drink alcohol, or use illegal drugs. Some items may interact with your medicine. What should I watch for while using this medication? Your condition will be monitored carefully while you are receiving  this medicine. You will need important blood work done while you are taking this medicine. Call your doctor or health care professional for advice if you get a fever, chills or sore throat, or other symptoms of a cold or flu. Do not treat yourself. This drug decreases your body's ability to fight infections. Try to avoid being around people who are sick. Some products may contain alcohol. Ask your health care professional if this medicine contains alcohol. Be sure to tell all health care professionals you are taking this medicine. Certain medicines, like metronidazole and disulfiram, can cause an unpleasant reaction when taken with alcohol. The reaction includes flushing, headache, nausea, vomiting, sweating, and increased thirst. The reaction can last from 30 minutes to several hours. You may get drowsy or dizzy. Do not drive, use machinery, or do anything that needs mental alertness until you know how this medicine affects you. Do not stand or sit up quickly, especially if you are an older patient. This reduces the risk of dizzy or fainting spells. Alcohol may interfere with the effect of this medicine. Talk to your health care professional about your risk of cancer. You may be more at risk for certain types of cancer if you take this medicine. Do not become pregnant while taking this medicine or for 6 months after stopping it. Women should inform their doctor if they wish to become pregnant or think they might be pregnant. There is a potential for serious side effects  to an unborn child. Talk to your health care professional or pharmacist for more information. Do not breast-feed an infant while taking this medicine or for 1 week after stopping it. Males who get this medicine must use a condom during sex with females who can get pregnant. If you get a woman pregnant, the baby could have birth defects. The baby could die before they are born. You will need to continue wearing a condom for 3 months after  stopping the medicine. Tell your health care provider right away if your partner becomes pregnant while you are taking this medicine. This may interfere with the ability to father a child. You should talk to your doctor or health care professional if you are concerned about your fertility. What side effects may I notice from receiving this medication? Side effects that you should report to your doctor or health care professional as soon as possible: allergic reactions like skin rash, itching or hives, swelling of the face, lips, or tongue blurred vision breathing problems changes in vision low blood counts - This drug may decrease the number of white blood cells, red blood cells and platelets. You may be at increased risk for infections and bleeding. nausea and vomiting pain, redness or irritation at site where injected pain, tingling, numbness in the hands or feet redness, blistering, peeling, or loosening of the skin, including inside the mouth signs of decreased platelets or bleeding - bruising, pinpoint red spots on the skin, black, tarry stools, nosebleeds signs of decreased red blood cells - unusually weak or tired, fainting spells, lightheadedness signs of infection - fever or chills, cough, sore throat, pain or difficulty passing urine swelling of the ankle, feet, hands Side effects that usually do not require medical attention (report to your doctor or health care professional if they continue or are bothersome): constipation diarrhea fingernail or toenail changes hair loss loss of appetite mouth sores muscle pain This list may not describe all possible side effects. Call your doctor for medical advice about side effects. You may report side effects to FDA at 1-800-FDA-1088. Where should I keep my medication? This drug is given in a hospital or clinic and will not be stored at home. NOTE: This sheet is a summary. It may not cover all possible information. If you have questions  about this medicine, talk to your doctor, pharmacist, or health care provider.  2023 Elsevier/Gold Standard (2021-05-28 00:00:00)

## 2021-11-26 NOTE — Progress Notes (Signed)
Approximately 5 minutes into treatment, pt went unresponsive.  27cc taxotere administered.  Taxotere paused.  Pt was unresponsive approximately 1 minute, consciousness returned with sternal rub.  Pt immediately immediately aware of person, place, time.  BP 188/122 initially, HR 102, RR 20, O2 100%.  Pt diaphoretic, and jittery, but otherwise no complaints.  Sherol Dade, PA to chairside.  Pt able to follow all commands. CBG 103.   Pt voiced need to have BM, pt wheeled to restroom where RN stayed with pt.  Pt was found to have been incontinent during event.  Pt ambulated back to chair without issues.  Dr Alen Blew informed by PA.  Will hold treatment for approximately 30 minutes and resume treatment if pt stable.  Will continue to monitor.    Pt transferred to ED via wheelchair with wife and PA.  VSS see flowsheets.  No additional complaints.

## 2021-11-26 NOTE — ED Triage Notes (Signed)
Patient reports he was brought over from the cancer center due to having a seizure during his chemo treatment.

## 2021-11-27 LAB — PROSTATE-SPECIFIC AG, SERUM (LABCORP): Prostate Specific Ag, Serum: 61.3 ng/mL — ABNORMAL HIGH (ref 0.0–4.0)

## 2021-11-29 ENCOUNTER — Inpatient Hospital Stay: Payer: Medicare HMO

## 2021-11-29 ENCOUNTER — Telehealth: Payer: Self-pay | Admitting: *Deleted

## 2021-11-29 LAB — GLUCOSE, CAPILLARY: Glucose-Capillary: 103 mg/dL — ABNORMAL HIGH (ref 70–99)

## 2021-11-29 NOTE — Telephone Encounter (Signed)
PC to patient, no answer, left VM - informed patient of Dr. Hazeline Junker message below.  Instructed patient to call this office with any questions/concerns, [phone].

## 2021-11-29 NOTE — Progress Notes (Signed)
RN called to follow up with patient after recent seizure like activity during his Taxotere infusion.   RN spoke with wife, patient currently at work.  Message left with wife for patient to call back.  RN provided education to patient's wife regarding next step in treatment.

## 2021-11-29 NOTE — Telephone Encounter (Signed)
-----   Message from Wyatt Portela, MD sent at 11/29/2021  8:23 AM EDT ----- Please let him know his PSA is down. I'm aware of his seizure last week. Will address next visit.

## 2021-11-30 ENCOUNTER — Encounter: Payer: Self-pay | Admitting: Neurology

## 2021-12-17 ENCOUNTER — Inpatient Hospital Stay: Payer: Medicare HMO

## 2021-12-17 ENCOUNTER — Other Ambulatory Visit: Payer: Self-pay

## 2021-12-17 ENCOUNTER — Inpatient Hospital Stay: Payer: Medicare HMO | Attending: Oncology | Admitting: Oncology

## 2021-12-17 VITALS — BP 161/93 | HR 71 | Temp 98.2°F | Resp 18

## 2021-12-17 VITALS — BP 135/93 | HR 77 | Temp 97.2°F | Resp 18 | Ht 68.0 in | Wt 162.1 lb

## 2021-12-17 DIAGNOSIS — C61 Malignant neoplasm of prostate: Secondary | ICD-10-CM

## 2021-12-17 DIAGNOSIS — C7951 Secondary malignant neoplasm of bone: Secondary | ICD-10-CM | POA: Insufficient documentation

## 2021-12-17 DIAGNOSIS — Z5189 Encounter for other specified aftercare: Secondary | ICD-10-CM | POA: Insufficient documentation

## 2021-12-17 DIAGNOSIS — Z79899 Other long term (current) drug therapy: Secondary | ICD-10-CM | POA: Diagnosis not present

## 2021-12-17 DIAGNOSIS — Z5111 Encounter for antineoplastic chemotherapy: Secondary | ICD-10-CM | POA: Insufficient documentation

## 2021-12-17 LAB — CMP (CANCER CENTER ONLY)
ALT: 26 U/L (ref 0–44)
AST: 26 U/L (ref 15–41)
Albumin: 4.1 g/dL (ref 3.5–5.0)
Alkaline Phosphatase: 79 U/L (ref 38–126)
Anion gap: 4 — ABNORMAL LOW (ref 5–15)
BUN: 16 mg/dL (ref 8–23)
CO2: 30 mmol/L (ref 22–32)
Calcium: 9.9 mg/dL (ref 8.9–10.3)
Chloride: 105 mmol/L (ref 98–111)
Creatinine: 0.81 mg/dL (ref 0.61–1.24)
GFR, Estimated: >60 mL/min (ref >60)
Glucose, Bld: 96 mg/dL (ref 70–99)
Potassium: 3.9 mmol/L (ref 3.5–5.1)
Sodium: 139 mmol/L (ref 135–145)
Total Bilirubin: 0.5 mg/dL (ref 0.3–1.2)
Total Protein: 7.1 g/dL (ref 6.5–8.1)

## 2021-12-17 LAB — CBC WITH DIFFERENTIAL (CANCER CENTER ONLY)
Abs Immature Granulocytes: 0.02 10*3/uL (ref 0.00–0.07)
Basophils Absolute: 0 10*3/uL (ref 0.0–0.1)
Basophils Relative: 1 %
Eosinophils Absolute: 0.2 10*3/uL (ref 0.0–0.5)
Eosinophils Relative: 5 %
HCT: 37.6 % — ABNORMAL LOW (ref 39.0–52.0)
Hemoglobin: 11.4 g/dL — ABNORMAL LOW (ref 13.0–17.0)
Immature Granulocytes: 1 %
Lymphocytes Relative: 32 %
Lymphs Abs: 1.2 10*3/uL (ref 0.7–4.0)
MCH: 22.8 pg — ABNORMAL LOW (ref 26.0–34.0)
MCHC: 30.3 g/dL (ref 30.0–36.0)
MCV: 75.2 fL — ABNORMAL LOW (ref 80.0–100.0)
Monocytes Absolute: 0.4 10*3/uL (ref 0.1–1.0)
Monocytes Relative: 11 %
Neutro Abs: 1.9 10*3/uL (ref 1.7–7.7)
Neutrophils Relative %: 50 %
Platelet Count: 271 10*3/uL (ref 150–400)
RBC: 5 MIL/uL (ref 4.22–5.81)
RDW: 16.2 % — ABNORMAL HIGH (ref 11.5–15.5)
WBC Count: 3.8 10*3/uL — ABNORMAL LOW (ref 4.0–10.5)
nRBC: 0 % (ref 0.0–0.2)

## 2021-12-17 MED ORDER — SODIUM CHLORIDE 0.9 % IV SOLN: Freq: Once | INTRAVENOUS | Status: AC

## 2021-12-17 MED ORDER — SODIUM CHLORIDE 0.9 % IV SOLN
10.0000 mg | Freq: Once | INTRAVENOUS | Status: AC
  Administered 2021-12-17: 10 mg via INTRAVENOUS
  Filled 2021-12-17: qty 10

## 2021-12-17 MED ORDER — SODIUM CHLORIDE 0.9 % IV SOLN
75.0000 mg/m2 | Freq: Once | INTRAVENOUS | Status: AC
  Administered 2021-12-17: 130 mg via INTRAVENOUS
  Filled 2021-12-17: qty 13

## 2021-12-17 NOTE — Progress Notes (Signed)
Hematology and Oncology Follow Up Visit  Steven Jensen [id number] 04/28/1951 71 y.o. 12/17/2021 9:52 AM Steven Hal Steven Jensen, MDKremer, Steven Jensen,*   Principle Diagnosis: 71 year old man with advanced prostate cancer with disease to the bone and lymphadenopathy diagnosed in March 2023.  He presented with a Gleason score of 9 and a PSA of 3500 and castration-sensitive disease.   Prior Therapy: He is status post prostate biopsy completed in February 2023 which confirmed the presence of Gleason score 9 prostate cancer in 6 of all 12 cores   Current therapy:   Androgen deprivation therapy under the care of Dr. Claudia Desanctis.  He received Eligard 45 mg in May 2023.  Taxotere chemotherapy started November 05, 2021 with 75 mg per metered square.  He completed cycle 1 of therapy without complication and cycle 2 was interrupted due to seizure activity.  He is here for cycle 3 of therapy.   Interim History: Steven Jensen is here for repeat evaluation.  During the last visit, he sustained a seizure-like activity during the infusion of Taxotere chemotherapy.  He was evaluated in the emergency department work-up was unrevealing.  Since that time, he reports feeling well without any residual issues or complications.  He denies any nausea, vomiting or neuropathy.     Medications: Updated on review. Current Outpatient Medications  Medication Sig Dispense Refill   HYDROcodone-acetaminophen (NORCO) 5-325 MG tablet Take 1 tablet by mouth every 6 (six) hours as needed for moderate pain. 60 tablet 0   metFORMIN (GLUCOPHAGE XR) 500 MG 24 hr tablet Take one nightly before bed. 90 tablet 2   Multiple Vitamin (MULTIVITAMIN) capsule Take 1 capsule by mouth daily. Multi vitamin with iron     pantoprazole (PROTONIX) 40 MG tablet Take 1 tablet (40 mg total) by mouth daily. 90 tablet 0   prochlorperazine (COMPAZINE) 10 MG tablet Take 1 tablet (10 mg total) by mouth every 6 (six) hours as needed for nausea or vomiting. 30  tablet 0   tamsulosin (FLOMAX) 0.4 MG CAPS capsule Take 1 capsule (0.4 mg total) by mouth daily. 30 capsule 3   No current facility-administered medications for this visit.     Allergies: No Known Allergies    Physical Exam:  Blood pressure (!) 135/93, pulse 77, temperature (!) 97.2 F (36.2 C), temperature source Temporal, resp. rate 18, height '5\' 8"'$  (1.727 m), weight 162 lb 1.6 oz (73.5 kg), SpO2 99 %.  ECOG: 1   General appearance: Alert, awake without any distress. Head: Atraumatic without abnormalities Oropharynx: Without any thrush or ulcers. Eyes: No scleral icterus. Lymph nodes: No lymphadenopathy noted in the cervical, supraclavicular, or axillary nodes Heart:regular rate and rhythm, without any murmurs or gallops.   Lung: Clear to auscultation without any rhonchi, wheezes or dullness to percussion. Abdomin: Soft, nontender without any shifting dullness or ascites. Musculoskeletal: No clubbing or cyanosis. Neurological: No motor or sensory deficits. Skin: No rashes or lesions.     Lab Results: Lab Results  Component Value Date   WBC 13.8 (H) 11/26/2021   HGB 9.8 (L) 11/26/2021   HCT 33.0 (L) 11/26/2021   MCV 76.6 (L) 11/26/2021   PLT 526 (H) 11/26/2021   PSA 3,535.93 (H) 08/13/2021     Chemistry      Component Value Date/Time   NA 140 11/26/2021 1600   K 4.3 11/26/2021 1600   CL 108 11/26/2021 1600   CO2 28 11/26/2021 1600   BUN 17 11/26/2021 1600   CREATININE 0.70 11/26/2021 1600   CREATININE  0.75 11/26/2021 1119   CREATININE 0.76 08/13/2021 1519      Component Value Date/Time   CALCIUM 9.2 11/26/2021 1600   ALKPHOS 104 11/26/2021 1600   AST 22 11/26/2021 1600   AST 20 11/26/2021 1119   ALT 23 11/26/2021 1600   ALT 21 11/26/2021 1119   BILITOT 0.6 11/26/2021 1600   BILITOT 0.2 (L) 11/26/2021 1119      IMPRESSION: No evidence of recent infarction, hemorrhage, or mass. No abnormal enhancement.     Latest Reference Range & Units 08/13/21  15:19 11/05/21 09:12 11/26/21 11:19  PSA < OR = 4.00 ng/mL 3,535.93 (H)    Prostate Specific Ag, Serum 0.0 - 4.0 ng/mL  173.0 (H) 61.3 (H)  (H): Data is abnormally high   Impression and Plan:   72 year old with:  1.  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     The natural course of his disease was reviewed at this time and treatment choices were discussed.  He is Taxotere chemotherapy and received 1 cycle and the second cycle was interrupted due to seizure activity.  The plan was to use triple therapy given his high-volume disease.  Given his seizure activity it is Taxotere.  Switching to abiraterone would be an option.  After discussion today, it is unlikely that Taxotere causes any seizures he is willing to rechallenge at this time.  Clearly if he has any other complications we will switch to abiraterone.   2.  IV access: No issues reported with peripheral veins at this time.  Port-A-Cath option will be deferred.   3.  Antiemetics: No nausea or vomiting reported at this time.  Compazine is available to him.   4.  Androgen deprivation therapy: This will continue indefinitely.  Next injection will be in November 2023 and care [facility].   5.  Bone directed therapy: He is on calcium and vitamin D supplements and I recommended obtaining dental clearance for Xgeva.   6.  Follow-up: He will return in 3 weeks for repeat follow-up.   30  minutes were dedicated to this visit.  The time was spent on reviewing laboratory data, disease status update and outlining future plan of care discussion.     Zola Button, MD 6/9/20239:52 AM

## 2021-12-17 NOTE — Progress Notes (Signed)
Per pharmacy, Taxotere ran as 1st time infusion. Pt tolerated well with no complaints. VSS (see flowsheets)

## 2021-12-17 NOTE — Patient Instructions (Signed)
[facility]  Discharge Instructions: ?Thank you for choosing [facility] to provide your oncology and hematology care.  ? ?If you have a lab appointment with the Cancer Center, please go directly to the Cancer Center and check in at the registration area. ?  ?Wear comfortable clothing and clothing appropriate for easy access to any Portacath or PICC line.  ? ?We strive to give you quality time with your provider. You may need to reschedule your appointment if you arrive late (15 or more minutes).  Arriving late affects you and other patients whose appointments are after yours.  Also, if you miss three or more appointments without notifying the office, you may be dismissed from the clinic at the provider?s discretion.    ?  ?For prescription refill requests, have your pharmacy contact our office and allow 72 hours for refills to be completed.   ? ?Today you received the following chemotherapy and/or immunotherapy agents : Taxotere    ?  ?To help prevent nausea and vomiting after your treatment, we encourage you to take your nausea medication as directed. ? ?BELOW ARE SYMPTOMS THAT SHOULD BE REPORTED IMMEDIATELY: ?*FEVER GREATER THAN 100.4 F (38 ?C) OR HIGHER ?*CHILLS OR SWEATING ?*NAUSEA AND VOMITING THAT IS NOT CONTROLLED WITH YOUR NAUSEA MEDICATION ?*UNUSUAL SHORTNESS OF BREATH ?*UNUSUAL BRUISING OR BLEEDING ?*URINARY PROBLEMS (pain or burning when urinating, or frequent urination) ?*BOWEL PROBLEMS (unusual diarrhea, constipation, pain near the anus) ?TENDERNESS IN MOUTH AND THROAT WITH OR WITHOUT PRESENCE OF ULCERS (sore throat, sores in mouth, or a toothache) ?UNUSUAL RASH, SWELLING OR PAIN  ?UNUSUAL VAGINAL DISCHARGE OR ITCHING  ? ?Items with * indicate a potential emergency and should be followed up as soon as possible or go to the Emergency Department if any problems should occur. ? ?Please show the CHEMOTHERAPY ALERT CARD or IMMUNOTHERAPY ALERT CARD at check-in to  the Emergency Department and triage nurse. ? ?Should you have questions after your visit or need to cancel or reschedule your appointment, please contact [facility]  Dept: [phone]  and follow the prompts.  Office hours are 8:00 a.m. to 4:30 p.m. Monday - Friday. Please note that voicemails left after 4:00 p.m. may not be returned until the following business day.  We are closed weekends and major holidays. You have access to a nurse at all times for urgent questions. Please call the main number to the clinic Dept: [phone] and follow the prompts. ? ? ?For any non-urgent questions, you may also contact your provider using MyChart. We now offer e-Visits for anyone 18 and older to request care online for non-urgent symptoms. For details visit [email or web address]. ?  ?Also download the MyChart app! Go to the app store, search "MyChart", open the app, select [facility], and log in with your MyChart username and password. ? ?Due to Covid, a mask is required upon entering the hospital/clinic. If you do not have a mask, one will be given to you upon arrival. For doctor visits, patients may have 1 support person aged 18 or older with them. For treatment visits, patients cannot have anyone with them due to current Covid guidelines and our immunocompromised population.  ? ?

## 2021-12-18 LAB — PROSTATE-SPECIFIC AG, SERUM (LABCORP): Prostate Specific Ag, Serum: 52.5 ng/mL — ABNORMAL HIGH (ref 0.0–4.0)

## 2021-12-20 ENCOUNTER — Telehealth: Payer: Self-pay | Admitting: *Deleted

## 2021-12-20 ENCOUNTER — Other Ambulatory Visit: Payer: Self-pay

## 2021-12-20 ENCOUNTER — Inpatient Hospital Stay: Payer: Medicare HMO

## 2021-12-20 VITALS — BP 130/88 | HR 92 | Temp 98.0°F | Resp 18

## 2021-12-20 DIAGNOSIS — Z79899 Other long term (current) drug therapy: Secondary | ICD-10-CM | POA: Diagnosis not present

## 2021-12-20 DIAGNOSIS — Z5189 Encounter for other specified aftercare: Secondary | ICD-10-CM | POA: Diagnosis not present

## 2021-12-20 DIAGNOSIS — C7951 Secondary malignant neoplasm of bone: Secondary | ICD-10-CM | POA: Diagnosis not present

## 2021-12-20 DIAGNOSIS — Z5111 Encounter for antineoplastic chemotherapy: Secondary | ICD-10-CM | POA: Diagnosis not present

## 2021-12-20 DIAGNOSIS — C61 Malignant neoplasm of prostate: Secondary | ICD-10-CM | POA: Diagnosis not present

## 2021-12-20 MED ORDER — PEGFILGRASTIM-JMDB 6 MG/0.6ML ~~LOC~~ SOSY
6.0000 mg | PREFILLED_SYRINGE | Freq: Once | SUBCUTANEOUS | Status: AC
  Administered 2021-12-20: 6 mg via SUBCUTANEOUS
  Filled 2021-12-20: qty 0.6

## 2021-12-20 NOTE — Telephone Encounter (Signed)
-----   Message from Wyatt Portela, MD sent at 12/20/2021  8:53 AM EDT ----- Please let him know his PSA is down

## 2021-12-20 NOTE — Telephone Encounter (Signed)
PC to patient, spoke with his wife Claudette, informed her of Dr. Hazeline Junker message below.  She verbalizes understanding.

## 2021-12-24 ENCOUNTER — Ambulatory Visit: Payer: Medicare HMO | Admitting: Family Medicine

## 2021-12-24 ENCOUNTER — Ambulatory Visit: Payer: Medicare HMO | Admitting: Neurology

## 2021-12-24 ENCOUNTER — Encounter: Payer: Self-pay | Admitting: Neurology

## 2021-12-24 VITALS — BP 138/74 | HR 88 | Ht 69.5 in | Wt 161.0 lb

## 2021-12-24 DIAGNOSIS — R55 Syncope and collapse: Secondary | ICD-10-CM | POA: Diagnosis not present

## 2021-12-24 NOTE — Addendum Note (Signed)
Addended by: Jake Seats on: 12/24/2021 11:44 AM   Modules accepted: Orders

## 2021-12-24 NOTE — Progress Notes (Signed)
pe  NEUROLOGY CONSULTATION NOTE  Steven Jensen MRN: [id number] DOB: 1950/09/23  Referring provider: Dr. Isla Pence (ER) Primary care provider: Dr. Abelino Derrick  Reason for consult:  syncope  Dear Dr Gilford Raid:  Thank you for your kind referral of Steven Jensen for consultation of the above symptoms. Although his history is well known to you, please allow me to reiterate it for the purpose of our medical record. He is accompanied by his wife who helps supplement the history today. Records and images were personally reviewed where available.   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 He was in his usual state of health and was feeling well that day, he was at the cancer center for his Taxotere infusion. He recalls asking for a snack, he put mayo and mustard on the [place] sandwich, took one bite, and had a hard time chewing it and getting it down. Once he swallowed it, he saw stars and felt dizzy, then woke up feeling weak, no focal weakness. Per notes, approximately 5 minutes into his treatment, he became unresponsive. The nurse heard snoring respirations and saw full body stiffening. He was sternal rubbed and regained consciousness. He had urinary incontinence, no tongue bite. Glucose was 103, he was diaphoretic and hypertensive 188/122, alert and oriented. He was brought to the ER where bloodwork showed WBC 13.8, normal CMP and UA. EKG reported "sinus rhythm, probable left atrial enlargement, LVH, ST elevation, consider anterior injury." He had a brain MRI with and without contrast which I personally reviewed, no acute changes seen. He denies any prior similar episodes of loss of consciousness, and none since 11/26/21. He had another treatment of Taxotere recently with no issues.  He and his wife  deny any staring episodes, any other gaps in time, no olfactory/gustatory hallucinations, focal numbness/tingling/weakness, myoclonic jerks. He has headaches every now and then and for the last couple of days has had a headache, no nausea/vomiting, photo/phonophobia. No further dizziness, no diplopia, no further issues with swallowing, no neck pain, bowel/bladder dysfunction. He has occasional back pain. Sometimes he feels his heart beating fast or for a second or two feels a brief pressure in his chest when he moves too fast. He is retired but continues to work at the [employer] center. He had a normal birth and early development.  There is no history of febrile convulsions, CNS infections such as meningitis/encephalitis, significant traumatic brain injury, neurosurgical procedures, or family history of seizures.   PAST MEDICAL HISTORY: Past Medical History:  Diagnosis Date   Cancer [facility])     PAST SURGICAL HISTORY: Past Surgical History:  Procedure Laterality Date   EYE SURGERY      MEDICATIONS: Current Outpatient Medications on File Prior to Visit  Medication Sig Dispense Refill   Calcium Carbonate (CALCIUM 600 PO) Take by mouth daily.     Cholecalciferol (VITAMIN D3) 25 MCG (1000 UT) CAPS Take by mouth.     ferrous sulfate 324 MG TBEC Take 324 mg by mouth daily.     metFORMIN (GLUCOPHAGE XR) 500 MG 24 hr tablet Take one nightly before bed. 90 tablet 2   Multiple Vitamin (MULTIVITAMIN) capsule Take 1 capsule by mouth daily. Multi vitamin with iron     pantoprazole (PROTONIX) 40 MG tablet Take 1 tablet (40 mg total) by mouth  daily. 90 tablet 0   prochlorperazine (COMPAZINE) 10 MG tablet Take 1 tablet (10 mg total) by mouth every 6 (six) hours as needed for nausea or vomiting. 30 tablet 0   tamsulosin (FLOMAX) 0.4 MG CAPS capsule Take 1 capsule (0.4 mg total) by mouth daily. 30 capsule 3   HYDROcodone-acetaminophen (NORCO) 5-325 MG tablet Take 1 tablet by mouth every 6 (six) hours as  needed for moderate pain. (Patient not taking: Reported on 12/24/2021) 60 tablet 0   oxyCODONE (OXY IR/ROXICODONE) 5 MG immediate release tablet Take 5 mg by mouth every 8 (eight) hours as needed. (Patient not taking: Reported on 12/24/2021)     No current facility-administered medications on file prior to visit.    ALLERGIES: No Known Allergies  FAMILY HISTORY: Family History  Problem Relation Age of Onset   Cancer Mother    Hypertension Mother    Cancer Sister    Cancer Brother    Hypertension Brother     SOCIAL HISTORY: Social History   Socioeconomic History   Marital status: Married    Spouse name: Not on file   Number of children: Not on file   Years of education: Not on file   Highest education level: Not on file  Occupational History   Not on file  Tobacco Use   Smoking status: Never   Smokeless tobacco: Never  Vaping Use   Vaping Use: Never used  Substance and Sexual Activity   Alcohol use: Never   Drug use: Never   Sexual activity: Yes  Other Topics Concern   Not on file  Social History Narrative   Right handed lives with wife    Social Determinants of Health   Financial Resource Strain: Not on file  Food Insecurity: Not on file  Transportation Needs: Not on file  Physical Activity: Not on file  Stress: Not on file  Social Connections: Not on file  Intimate Partner Violence: Not on file     PHYSICAL EXAM: Vitals:   12/24/21 1032  BP: 138/74  Pulse: 88  SpO2: 96%   General: No acute distress Head:  Normocephalic/atraumatic Skin/Extremities: No rash, no edema Neurological Exam: Mental status: alert and awake, no dysarthria or aphasia, Fund of knowledge is appropriate.  Recent and remote memory are intact.  Attention and concentration are normal.    Cranial nerves: CN I: not tested CN II: pupils equal, round and reactive to light, visual fields intact CN III, IV, VI:  full range of motion, no nystagmus, no ptosis CN V: facial sensation  intact CN VII: upper and lower face symmetric CN VIII: hearing intact to conversation Bulk & Tone: normal, no fasciculations. Motor: 5/5 throughout with no pronator drift. Sensation: intact to light touch, cold, pin, vibration  sense.  No extinction to double simultaneous stimulation.  Romberg test negative Deep Tendon Reflexes: +2 throughout Cerebellar: no incoordination on finger to nose testing Gait: narrow-based and steady, no ataxia Tremor: none   IMPRESSION: This is a very pleasant 71 year old right-handed man with a history of prostate cancer with mets to the bone and lymph nodes, presenting for evaluation of an episode of loss of consciousness last 11/26/2021 that occurred within 5 minutes of chemotherapy. He has had another session since then with no recurrence. Episode suggestive of vasovagal syncope, less likely seizure. Neurological exam and brain MRI with and without contrast normal. We discussed doing an EEG for completion. He will be referred to Cardiology for syncope and abnormal EKG. We discussed  [place] driving laws. Our office will call with EEG results, if normal, follow-up as needed. Call for any changes.   Thank you for allowing me to participate in the care of this patient. Please do not hesitate to call for any questions or concerns.   Ellouise Newer, M.D.  CC: Dr. Gilford Raid, Dr. Ethelene Hal, Dr. Alen Blew

## 2021-12-24 NOTE — Patient Instructions (Signed)
It was a pleasure to meet you.  Schedule routine EEG  2. Referral will be sent to Cardiology.  3. If EEG is normal, follow-up as needed. Call for any changes.

## 2021-12-24 NOTE — Addendum Note (Signed)
Addended by: Jake Seats on: 12/24/2021 12:01 PM   Modules accepted: Orders

## 2021-12-31 ENCOUNTER — Ambulatory Visit: Payer: Medicare HMO | Admitting: Family Medicine

## 2022-01-03 ENCOUNTER — Other Ambulatory Visit: Payer: Self-pay | Admitting: Family Medicine

## 2022-01-03 DIAGNOSIS — K2960 Other gastritis without bleeding: Secondary | ICD-10-CM

## 2022-01-06 MED FILL — Dexamethasone Sodium Phosphate Inj 100 MG/10ML: INTRAMUSCULAR | Qty: 1 | Status: AC

## 2022-01-07 ENCOUNTER — Inpatient Hospital Stay: Payer: Medicare HMO

## 2022-01-07 ENCOUNTER — Other Ambulatory Visit: Payer: Self-pay

## 2022-01-07 ENCOUNTER — Inpatient Hospital Stay: Payer: Medicare HMO | Admitting: Oncology

## 2022-01-07 VITALS — BP 138/86 | HR 87 | Temp 98.0°F | Resp 16 | Ht 69.0 in | Wt 162.5 lb

## 2022-01-07 DIAGNOSIS — C7951 Secondary malignant neoplasm of bone: Secondary | ICD-10-CM | POA: Diagnosis not present

## 2022-01-07 DIAGNOSIS — C61 Malignant neoplasm of prostate: Secondary | ICD-10-CM

## 2022-01-07 DIAGNOSIS — Z5189 Encounter for other specified aftercare: Secondary | ICD-10-CM | POA: Diagnosis not present

## 2022-01-07 DIAGNOSIS — Z5111 Encounter for antineoplastic chemotherapy: Secondary | ICD-10-CM | POA: Diagnosis not present

## 2022-01-07 DIAGNOSIS — Z79899 Other long term (current) drug therapy: Secondary | ICD-10-CM | POA: Diagnosis not present

## 2022-01-07 LAB — CMP (CANCER CENTER ONLY)
ALT: 16 U/L (ref 0–44)
AST: 19 U/L (ref 15–41)
Albumin: 3.7 g/dL (ref 3.5–5.0)
Alkaline Phosphatase: 84 U/L (ref 38–126)
Anion gap: 4 — ABNORMAL LOW (ref 5–15)
BUN: 17 mg/dL (ref 8–23)
CO2: 29 mmol/L (ref 22–32)
Calcium: 9.6 mg/dL (ref 8.9–10.3)
Chloride: 106 mmol/L (ref 98–111)
Creatinine: 0.76 mg/dL (ref 0.61–1.24)
GFR, Estimated: >60 mL/min (ref >60)
Glucose, Bld: 80 mg/dL (ref 70–99)
Potassium: 4.6 mmol/L (ref 3.5–5.1)
Sodium: 139 mmol/L (ref 135–145)
Total Bilirubin: 0.3 mg/dL (ref 0.3–1.2)
Total Protein: 6.7 g/dL (ref 6.5–8.1)

## 2022-01-07 LAB — CBC WITH DIFFERENTIAL (CANCER CENTER ONLY)
Abs Immature Granulocytes: 0.04 10*3/uL (ref 0.00–0.07)
Basophils Absolute: 0.1 10*3/uL (ref 0.0–0.1)
Basophils Relative: 1 %
Eosinophils Absolute: 0 10*3/uL (ref 0.0–0.5)
Eosinophils Relative: 0 %
HCT: 33.5 % — ABNORMAL LOW (ref 39.0–52.0)
Hemoglobin: 10.3 g/dL — ABNORMAL LOW (ref 13.0–17.0)
Immature Granulocytes: 1 %
Lymphocytes Relative: 25 %
Lymphs Abs: 1.2 10*3/uL (ref 0.7–4.0)
MCH: 23 pg — ABNORMAL LOW (ref 26.0–34.0)
MCHC: 30.7 g/dL (ref 30.0–36.0)
MCV: 74.9 fL — ABNORMAL LOW (ref 80.0–100.0)
Monocytes Absolute: 0.7 10*3/uL (ref 0.1–1.0)
Monocytes Relative: 15 %
Neutro Abs: 2.9 10*3/uL (ref 1.7–7.7)
Neutrophils Relative %: 58 %
Platelet Count: 522 10*3/uL — ABNORMAL HIGH (ref 150–400)
RBC: 4.47 MIL/uL (ref 4.22–5.81)
RDW: 15.8 % — ABNORMAL HIGH (ref 11.5–15.5)
WBC Count: 4.9 10*3/uL (ref 4.0–10.5)
nRBC: 0 % (ref 0.0–0.2)

## 2022-01-07 MED ORDER — SODIUM CHLORIDE 0.9 % IV SOLN
10.0000 mg | Freq: Once | INTRAVENOUS | Status: AC
  Administered 2022-01-07: 10 mg via INTRAVENOUS
  Filled 2022-01-07: qty 10

## 2022-01-07 MED ORDER — SODIUM CHLORIDE 0.9 % IV SOLN
75.0000 mg/m2 | Freq: Once | INTRAVENOUS | Status: AC
  Administered 2022-01-07: 130 mg via INTRAVENOUS
  Filled 2022-01-07: qty 13

## 2022-01-07 MED ORDER — SODIUM CHLORIDE 0.9 % IV SOLN: Freq: Once | INTRAVENOUS | Status: AC

## 2022-01-07 NOTE — Progress Notes (Signed)
Patient had a really hard time with his veins today. Discussed drinking more water. Multiple IV failed and one failed with good blood return after beginning treatment. Patient had his dexamethasone running and received part of it subcutaneously in his left wrist. Discussed with pharmacy and Dr. Alen Blew. Patient wrist was warmed, a new IV was placed and new dexamethasone was ordered and given. Patient has no significant pain at the site and the swelling is obviously receding.

## 2022-01-07 NOTE — Progress Notes (Signed)
Hematology and Oncology Follow Up Visit  Steven Jensen [id number] Nov 23, 1950 71 y.o. 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  He was found to have Gleason score of 9 and a PSA of 3500 at the time of diagnosis.  Prior Therapy: He is status post prostate biopsy completed in February 2023 which confirmed the presence of Gleason score 9 prostate cancer in 6 of all 12 cores   Current therapy:   Androgen deprivation therapy under the care of Dr. Claudia Desanctis.  He received Eligard 45 mg in May 2023.  Taxotere chemotherapy started November 05, 2021 with 75 mg per metered square.  He completed cycle 1 of therapy without complication and cycle 2 was interrupted due to seizure activity.  He is here for cycle 4 of therapy.   Interim History: Steven Jensen is here for a follow-up evaluation.  Since last visit, he reports feeling well without any major complaints.  He denies any nausea, vomiting or abdominal pain.  He denies any worsening neuropathy or nail changes.  He has tolerated chemotherapy well.  He reports no pain or pathological fractures.  His performance status quality of life remains stable.    Medications: Reviewed without changes. Current Outpatient Medications  Medication Sig Dispense Refill   Calcium Carbonate (CALCIUM 600 PO) Take by mouth daily.     Cholecalciferol (VITAMIN D3) 25 MCG (1000 UT) CAPS Take by mouth.     ferrous sulfate 324 MG TBEC Take 324 mg by mouth daily.     HYDROcodone-acetaminophen (NORCO) 5-325 MG tablet Take 1 tablet by mouth every 6 (six) hours as needed for moderate pain. (Patient not taking: Reported on 12/24/2021) 60 tablet 0   metFORMIN (GLUCOPHAGE XR) 500 MG 24 hr tablet Take one nightly before bed. 90 tablet 2   Multiple Vitamin (MULTIVITAMIN) capsule Take 1 capsule by mouth  daily. Multi vitamin with iron     oxyCODONE (OXY IR/ROXICODONE) 5 MG immediate release tablet Take 5 mg by mouth every 8 (eight) hours as needed. (Patient not taking: Reported on 12/24/2021)     pantoprazole (PROTONIX) 40 MG tablet Take 1 tablet by mouth once daily 90 tablet 0   prochlorperazine (COMPAZINE) 10 MG tablet Take 1 tablet (10 mg total) by mouth every 6 (six) hours as needed for nausea or vomiting. 30 tablet 0   tamsulosin (FLOMAX) 0.4 MG CAPS capsule Take 1 capsule (0.4 mg total) by mouth daily. 30 capsule 3   No current facility-administered medications for this visit.     Allergies: No Known Allergies    Physical Exam: Blood pressure 138/86, pulse 87, temperature 98 F (36.7 C), temperature source Oral, resp. rate 16, height '5\' 9"'$  (1.753 m), weight 162 lb 8 oz (73.7 kg), SpO2 97 %.    ECOG: 1   General appearance: Comfortable appearing without any discomfort Head: Normocephalic without any trauma Oropharynx: Mucous membranes are moist and pink without any thrush or ulcers. Eyes: Pupils are equal and round reactive to light. Lymph nodes: No cervical, supraclavicular, inguinal or axillary lymphadenopathy.   Heart:regular rate and rhythm.  S1 and S2 without leg edema. Lung: Clear without any rhonchi or wheezes.  No dullness to percussion. Abdomin: Soft, nontender, nondistended with good bowel sounds.  No hepatosplenomegaly. Musculoskeletal: No joint deformity or effusion.  Full range of motion noted. Neurological: No deficits noted on motor, sensory and deep  tendon reflex exam. Skin: No petechial rash or dryness.  Appeared moist.      Lab Results: Lab Results  Component Value Date   WBC 3.8 (L) 12/17/2021   HGB 11.4 (L) 12/17/2021   HCT 37.6 (L) 12/17/2021   MCV 75.2 (L) 12/17/2021   PLT 271 12/17/2021   PSA 3,535.93 (H) 08/13/2021     Chemistry      Component Value Date/Time   NA 139 12/17/2021 0948   K 3.9 12/17/2021 0948   CL 105 12/17/2021 0948    CO2 30 12/17/2021 0948   BUN 16 12/17/2021 0948   CREATININE 0.81 12/17/2021 0948   CREATININE 0.76 08/13/2021 1519      Component Value Date/Time   CALCIUM 9.9 12/17/2021 0948   ALKPHOS 79 12/17/2021 0948   AST 26 12/17/2021 0948   ALT 26 12/17/2021 0948   BILITOT 0.5 12/17/2021 0948      Latest Reference Range & Units 08/13/21 15:19 11/05/21 09:12 11/26/21 11:19 12/17/21 09:48  PSA < OR = 4.00 ng/mL 3,535.93 (H)     Prostate Specific Ag, Serum 0.0 - 4.0 ng/mL  173.0 (H) 61.3 (H) 52.5 (H)  (H): Data is abnormally high   Impression and Plan:   71 year old with:  1.  Advanced prostate cancer with disease to the bone and lymphadenopathy diagnosed in March 2023.  He has castration-sensitive disease.   He is currently on Taxotere chemotherapy and received the last cycle without complications.  Risks and benefits of proceeding with cycle 4 were discussed.  Complications that include arthralgias, myalgias and neuropathy were reviewed.  The plan is to complete 6 cycles of therapy and add abiraterone at that time.  He is agreeable to proceed at this time.   2.  IV access: Peripheral veins are currently in use at this time.  No need for Port-A-Cath.   3.  Antiemetics: Compazine is available to him without any nausea or vomiting.   4.  Androgen deprivation therapy: His next injection will be in November 2023 under the care of [facility].  I recommended continuing this indefinitely.   5.  Bone directed therapy: Delton See will be considered after obtaining dental clearance.  I recommended calcium and vitamin D supplements in the meantime.  6.  Vasovagal syncope/seizure: Resolved at this time without any evidence of seizure activity.  He was evaluated by neurology and felt most likely syncope rather than seizure.   7.  Follow-up: In 3 weeks for the next cycle of therapy.  He will be scheduled for the last chemotherapy cycle in 6 weeks.   30  minutes were spent on this encounter.  The  time was dedicated to reviewing laboratory data, disease status update and outlining future plan of care discussion.     Zola Button, MD 6/30/20239:50 AM

## 2022-01-07 NOTE — Patient Instructions (Signed)
[facility]   Discharge Instructions: Thank you for choosing [facility] to provide your oncology and hematology care.   If you have a lab appointment with the Cancer Center, please go directly to the Cancer Center and check in at the registration area.   Wear comfortable clothing and clothing appropriate for easy access to any Portacath or PICC line.   We strive to give you quality time with your provider. You may need to reschedule your appointment if you arrive late (15 or more minutes).  Arriving late affects you and other patients whose appointments are after yours.  Also, if you miss three or more appointments without notifying the office, you may be dismissed from the clinic at the provider's discretion.      For prescription refill requests, have your pharmacy contact our office and allow 72 hours for refills to be completed.    Today you received the following chemotherapy and/or immunotherapy agents: docetaxel      To help prevent nausea and vomiting after your treatment, we encourage you to take your nausea medication as directed.  BELOW ARE SYMPTOMS THAT SHOULD BE REPORTED IMMEDIATELY: *FEVER GREATER THAN 100.4 F (38 C) OR HIGHER *CHILLS OR SWEATING *NAUSEA AND VOMITING THAT IS NOT CONTROLLED WITH YOUR NAUSEA MEDICATION *UNUSUAL SHORTNESS OF BREATH *UNUSUAL BRUISING OR BLEEDING *URINARY PROBLEMS (pain or burning when urinating, or frequent urination) *BOWEL PROBLEMS (unusual diarrhea, constipation, pain near the anus) TENDERNESS IN MOUTH AND THROAT WITH OR WITHOUT PRESENCE OF ULCERS (sore throat, sores in mouth, or a toothache) UNUSUAL RASH, SWELLING OR PAIN  UNUSUAL VAGINAL DISCHARGE OR ITCHING   Items with * indicate a potential emergency and should be followed up as soon as possible or go to the Emergency Department if any problems should occur.  Please show the CHEMOTHERAPY ALERT CARD or IMMUNOTHERAPY ALERT CARD at check-in  to the Emergency Department and triage nurse.  Should you have questions after your visit or need to cancel or reschedule your appointment, please contact [facility]  Dept: [phone]  and follow the prompts.  Office hours are 8:00 a.m. to 4:30 p.m. Monday - Friday. Please note that voicemails left after 4:00 p.m. may not be returned until the following business day.  We are closed weekends and major holidays. You have access to a nurse at all times for urgent questions. Please call the main number to the clinic Dept: [phone] and follow the prompts.   For any non-urgent questions, you may also contact your provider using MyChart. We now offer e-Visits for anyone 18 and older to request care online for non-urgent symptoms. For details visit [email or web address].   Also download the MyChart app! Go to the app store, search "MyChart", open the app, select [facility], and log in with your MyChart username and password.  Masks are optional in the cancer centers. If you would like for your care team to wear a mask while they are taking care of you, please let them know. For doctor visits, patients may have with them one support person who is at least 72 years old. At this time, visitors are not allowed in the infusion area. 

## 2022-01-08 LAB — PROSTATE-SPECIFIC AG, SERUM (LABCORP): Prostate Specific Ag, Serum: 42 ng/mL — ABNORMAL HIGH (ref 0.0–4.0)

## 2022-01-09 NOTE — Progress Notes (Deleted)
Cardiology Office Note:    Date:  01/09/2022   ID:  Steven Jensen, DOB 1950-08-04, MRN [id number]  PCP:  Libby Maw, MD  Cardiologist:  None  Electrophysiologist:  None   Referring MD: Cameron Sprang, MD   No chief complaint on file. ***  History of Present Illness:    Steven Jensen is a 71 y.o. 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 Delice Lesch for evaluation of syncope.  Had an episode of loss of consciousness 11/26/2021 after chemotherapy.  Seen by neurology, felt less likely seizure in more likely vasovagal syncope.  Past Medical History:  Diagnosis Date   Cancer [facility])     Past Surgical History:  Procedure Laterality Date   EYE SURGERY      Current Medications: No outpatient medications have been marked as taking for the 01/14/22 encounter (Appointment) with Donato Heinz, MD.     Allergies:   Patient has no known allergies.   Social History   Socioeconomic History   Marital status: Married    Spouse name: Not on file   Number of children: Not on file   Years of education: Not on file   Highest education level: Not on file  Occupational History   Not on file  Tobacco Use   Smoking status: Never   Smokeless tobacco: Never  Vaping Use   Vaping Use: Never used  Substance and Sexual Activity   Alcohol use: Never   Drug use: Never   Sexual activity: Yes  Other Topics Concern   Not on file  Social History Narrative   Right handed lives with wife    Social Determinants of Health   Financial Resource Strain: Not on file  Food Insecurity: Not on file  Transportation Needs: Not on file  Physical Activity: Not on file  Stress: Not on file  Social Connections: Not on file     Family History: The patient's ***family history includes Cancer in his brother, mother, and sister; Hypertension in his brother and mother.  ROS:   Please see the history of present illness.    *** All other  systems reviewed and are negative.  EKGs/Labs/Other Studies Reviewed:    The following studies were reviewed today: ***  EKG:  EKG is *** ordered today.  The ekg ordered today demonstrates ***  Recent Labs: 11/26/2021: Magnesium 2.0 01/07/2022: ALT 16; BUN 17; Creatinine 0.76; Hemoglobin 10.3; Platelet Count 522; Potassium 4.6; Sodium 139  Recent Lipid Panel    Component Value Date/Time   LDLDIRECT 116 (H) 08/13/2021 1519    Physical Exam:    VS:  There were no vitals taken for this visit.    Wt Readings from Last 3 Encounters:  01/07/22 162 lb 8 oz (73.7 kg)  12/24/21 161 lb (73 kg)  12/17/21 162 lb 1.6 oz (73.5 kg)     GEN: *** Well nourished, well developed in no acute distress HEENT: Normal NECK: No JVD; No carotid bruits LYMPHATICS: No lymphadenopathy CARDIAC: ***RRR, no murmurs, rubs, gallops RESPIRATORY:  Clear to auscultation without rales, wheezing or rhonchi  ABDOMEN: Soft, non-tender, non-distended MUSCULOSKELETAL:  No edema; No deformity  SKIN: Warm and dry NEUROLOGIC:  Alert and oriented x 3 PSYCHIATRIC:  Normal affect   ASSESSMENT:    No diagnosis found. PLAN:    Syncope:  RTC in***  Medication Adjustments/Labs and Tests Ordered: Current medicines are reviewed at length with the patient today.  Concerns regarding medicines are outlined above.  No orders of the defined types were placed in this encounter.  No orders of the defined types were placed in this encounter.   There are no Patient Instructions on file for this visit.   Signed, Donato Heinz, MD  01/09/2022 10:48 PM    [place]

## 2022-01-10 ENCOUNTER — Telehealth: Payer: Self-pay

## 2022-01-10 ENCOUNTER — Other Ambulatory Visit: Payer: Self-pay

## 2022-01-10 ENCOUNTER — Inpatient Hospital Stay: Payer: Medicare HMO | Attending: Oncology

## 2022-01-10 VITALS — BP 102/77 | HR 97 | Temp 98.0°F | Resp 18

## 2022-01-10 DIAGNOSIS — C61 Malignant neoplasm of prostate: Secondary | ICD-10-CM | POA: Insufficient documentation

## 2022-01-10 DIAGNOSIS — Z5111 Encounter for antineoplastic chemotherapy: Secondary | ICD-10-CM | POA: Insufficient documentation

## 2022-01-10 DIAGNOSIS — D509 Iron deficiency anemia, unspecified: Secondary | ICD-10-CM | POA: Diagnosis not present

## 2022-01-10 DIAGNOSIS — Z5189 Encounter for other specified aftercare: Secondary | ICD-10-CM | POA: Insufficient documentation

## 2022-01-10 DIAGNOSIS — C7951 Secondary malignant neoplasm of bone: Secondary | ICD-10-CM | POA: Insufficient documentation

## 2022-01-10 MED ORDER — PEGFILGRASTIM-JMDB 6 MG/0.6ML ~~LOC~~ SOSY
6.0000 mg | PREFILLED_SYRINGE | Freq: Once | SUBCUTANEOUS | Status: AC
  Administered 2022-01-10: 6 mg via SUBCUTANEOUS
  Filled 2022-01-10: qty 0.6

## 2022-01-10 NOTE — Telephone Encounter (Addendum)
Spoke with patient's wife to advise of message/result below.  ----- Message from Wyatt Portela, MD sent at 01/10/2022  8:58 AM EDT ----- Please let him know his PSA is down

## 2022-01-14 ENCOUNTER — Ambulatory Visit: Payer: Medicare HMO | Admitting: Family Medicine

## 2022-01-14 ENCOUNTER — Ambulatory Visit: Payer: Medicare HMO | Admitting: Cardiology

## 2022-01-21 ENCOUNTER — Ambulatory Visit: Payer: Medicare HMO | Admitting: Family Medicine

## 2022-01-27 ENCOUNTER — Other Ambulatory Visit: Payer: Self-pay | Admitting: Physician Assistant

## 2022-01-27 DIAGNOSIS — C61 Malignant neoplasm of prostate: Secondary | ICD-10-CM

## 2022-01-27 MED FILL — Dexamethasone Sodium Phosphate Inj 100 MG/10ML: INTRAMUSCULAR | Qty: 1 | Status: AC

## 2022-01-28 ENCOUNTER — Inpatient Hospital Stay: Payer: Medicare HMO

## 2022-01-28 ENCOUNTER — Inpatient Hospital Stay (HOSPITAL_BASED_OUTPATIENT_CLINIC_OR_DEPARTMENT_OTHER): Payer: Medicare HMO | Admitting: Physician Assistant

## 2022-01-28 ENCOUNTER — Other Ambulatory Visit: Payer: Self-pay

## 2022-01-28 ENCOUNTER — Ambulatory Visit: Payer: Medicare HMO | Admitting: Family Medicine

## 2022-01-28 VITALS — BP 125/82 | HR 81 | Temp 97.7°F | Resp 15

## 2022-01-28 VITALS — BP 154/91 | HR 88 | Temp 98.3°F | Resp 16 | Wt 164.2 lb

## 2022-01-28 DIAGNOSIS — C61 Malignant neoplasm of prostate: Secondary | ICD-10-CM | POA: Diagnosis not present

## 2022-01-28 DIAGNOSIS — D509 Iron deficiency anemia, unspecified: Secondary | ICD-10-CM

## 2022-01-28 DIAGNOSIS — C7951 Secondary malignant neoplasm of bone: Secondary | ICD-10-CM | POA: Diagnosis not present

## 2022-01-28 DIAGNOSIS — Z5111 Encounter for antineoplastic chemotherapy: Secondary | ICD-10-CM | POA: Diagnosis not present

## 2022-01-28 DIAGNOSIS — Z5189 Encounter for other specified aftercare: Secondary | ICD-10-CM | POA: Diagnosis not present

## 2022-01-28 LAB — CBC WITH DIFFERENTIAL (CANCER CENTER ONLY)
Abs Immature Granulocytes: 0.02 10*3/uL (ref 0.00–0.07)
Basophils Absolute: 0.1 10*3/uL (ref 0.0–0.1)
Basophils Relative: 1 %
Eosinophils Absolute: 0 10*3/uL (ref 0.0–0.5)
Eosinophils Relative: 0 %
HCT: 33.7 % — ABNORMAL LOW (ref 39.0–52.0)
Hemoglobin: 10.5 g/dL — ABNORMAL LOW (ref 13.0–17.0)
Immature Granulocytes: 0 %
Lymphocytes Relative: 26 %
Lymphs Abs: 1.3 10*3/uL (ref 0.7–4.0)
MCH: 23.2 pg — ABNORMAL LOW (ref 26.0–34.0)
MCHC: 31.2 g/dL (ref 30.0–36.0)
MCV: 74.6 fL — ABNORMAL LOW (ref 80.0–100.0)
Monocytes Absolute: 0.6 10*3/uL (ref 0.1–1.0)
Monocytes Relative: 13 %
Neutro Abs: 3 10*3/uL (ref 1.7–7.7)
Neutrophils Relative %: 60 %
Platelet Count: 439 10*3/uL — ABNORMAL HIGH (ref 150–400)
RBC: 4.52 MIL/uL (ref 4.22–5.81)
RDW: 15.9 % — ABNORMAL HIGH (ref 11.5–15.5)
WBC Count: 5 10*3/uL (ref 4.0–10.5)
nRBC: 0 % (ref 0.0–0.2)

## 2022-01-28 LAB — CMP (CANCER CENTER ONLY)
ALT: 18 U/L (ref 0–44)
AST: 21 U/L (ref 15–41)
Albumin: 3.9 g/dL (ref 3.5–5.0)
Alkaline Phosphatase: 85 U/L (ref 38–126)
Anion gap: 4 — ABNORMAL LOW (ref 5–15)
BUN: 13 mg/dL (ref 8–23)
CO2: 30 mmol/L (ref 22–32)
Calcium: 9.7 mg/dL (ref 8.9–10.3)
Chloride: 105 mmol/L (ref 98–111)
Creatinine: 0.75 mg/dL (ref 0.61–1.24)
GFR, Estimated: >60 mL/min (ref >60)
Glucose, Bld: 86 mg/dL (ref 70–99)
Potassium: 4.1 mmol/L (ref 3.5–5.1)
Sodium: 139 mmol/L (ref 135–145)
Total Bilirubin: 0.3 mg/dL (ref 0.3–1.2)
Total Protein: 6.8 g/dL (ref 6.5–8.1)

## 2022-01-28 LAB — IRON AND IRON BINDING CAPACITY (CC-WL,HP ONLY)
Iron: 77 ug/dL (ref 45–182)
Saturation Ratios: 24 % (ref 17.9–39.5)
TIBC: 328 ug/dL (ref 250–450)
UIBC: 251 ug/dL (ref 117–376)

## 2022-01-28 LAB — FERRITIN: Ferritin: 85 ng/mL (ref 24–336)

## 2022-01-28 MED ORDER — SODIUM CHLORIDE 0.9 % IV SOLN
75.0000 mg/m2 | Freq: Once | INTRAVENOUS | Status: AC
  Administered 2022-01-28: 130 mg via INTRAVENOUS
  Filled 2022-01-28: qty 13

## 2022-01-28 MED ORDER — SODIUM CHLORIDE 0.9 % IV SOLN: Freq: Once | INTRAVENOUS | Status: AC

## 2022-01-28 MED ORDER — SODIUM CHLORIDE 0.9 % IV SOLN
10.0000 mg | Freq: Once | INTRAVENOUS | Status: AC
  Administered 2022-01-28: 10 mg via INTRAVENOUS
  Filled 2022-01-28: qty 10

## 2022-01-28 NOTE — Patient Instructions (Signed)
[facility]  Discharge Instructions: Thank you for choosing [facility] to provide your oncology and hematology care.   If you have a lab appointment with the [facility], please go directly to the [facility] and check in at the registration area.   Wear comfortable clothing and clothing appropriate for easy access to any Portacath or PICC line.   We strive to give you quality time with your provider. You may need to reschedule your appointment if you arrive late (15 or more minutes).  Arriving late affects you and other patients whose appointments are after yours.  Also, if you miss three or more appointments without notifying the office, you may be dismissed from the clinic at the provider's discretion.      For prescription refill requests, have your pharmacy contact our office and allow 72 hours for refills to be completed.    Today you received the following chemotherapy and/or immunotherapy agents : Taxotere      To help prevent nausea and vomiting after your treatment, we encourage you to take your nausea medication as directed.  BELOW ARE SYMPTOMS THAT SHOULD BE REPORTED IMMEDIATELY: *FEVER GREATER THAN 100.4 F (38 C) OR HIGHER *CHILLS OR SWEATING *NAUSEA AND VOMITING THAT IS NOT CONTROLLED WITH YOUR NAUSEA MEDICATION *UNUSUAL SHORTNESS OF BREATH *UNUSUAL BRUISING OR BLEEDING *URINARY PROBLEMS (pain or burning when urinating, or frequent urination) *BOWEL PROBLEMS (unusual diarrhea, constipation, pain near the anus) TENDERNESS IN MOUTH AND THROAT WITH OR WITHOUT PRESENCE OF ULCERS (sore throat, sores in mouth, or a toothache) UNUSUAL RASH, SWELLING OR PAIN  UNUSUAL VAGINAL DISCHARGE OR ITCHING   Items with * indicate a potential emergency and should be followed up as soon as possible or go to the Emergency Department if any problems should occur.  Please show the CHEMOTHERAPY ALERT CARD or IMMUNOTHERAPY ALERT CARD at check-in to  the Emergency Department and triage nurse.  Should you have questions after your visit or need to cancel or reschedule your appointment, please contact [facility]  Dept: [phone]  and follow the prompts.  Office hours are 8:00 a.m. to 4:30 p.m. Monday - Friday. Please note that voicemails left after 4:00 p.m. may not be returned until the following business day.  We are closed weekends and major holidays. You have access to a nurse at all times for urgent questions. Please call the main number to the clinic Dept: [phone] and follow the prompts.   For any non-urgent questions, you may also contact your provider using MyChart. We now offer e-Visits for anyone 26 and older to request care online for non-urgent symptoms. For details visit [email or web address].   Also download the MyChart app! Go to the app store, search "MyChart", open the app, select [facility], and log in with your MyChart username and password.  Masks are optional in the cancer centers. If you would like for your care team to wear a mask while they are taking care of you, please let them know. For doctor visits, patients may have with them one support person who is at least 72 years old. At this time, visitors are not allowed in the infusion area.

## 2022-01-29 LAB — PROSTATE-SPECIFIC AG, SERUM (LABCORP): Prostate Specific Ag, Serum: 34.1 ng/mL — ABNORMAL HIGH (ref 0.0–4.0)

## 2022-01-30 ENCOUNTER — Encounter: Payer: Self-pay | Admitting: Oncology

## 2022-01-30 NOTE — Progress Notes (Signed)
Hematology and Oncology Follow Up Visit  Steven Jensen [id number] [birth year] 71 y.o. 01/30/2022 8:39 PM [name], MDKremer, [name],*   Principle Diagnosis: 71 year old man with castration-sensitive advanced prostate cancer with disease to the bone and lymphadenopathy diagnosed in March 2023.  He was found to have Gleason score of 9 and a PSA of 3500 at the time of diagnosis.  Prior Therapy: He is status post prostate biopsy completed in February 2023 which confirmed the presence of Gleason score 9 prostate cancer in 6 of all 12 cores  Current therapy:   Androgen deprivation therapy under the care of Dr. Claudia Desanctis.  He received Eligard 45 mg in May 2023.  Taxotere chemotherapy started November 05, 2021 with 75 mg per metered square.  He completed cycle 1 of therapy without complication and cycle 2 was interrupted due to seizure activity.  He is here for cycle 5 of therapy.   Interim History: Steven Jensen is here for a follow up before cycle 5 of taxotere chemotherapy. He is unaccompanied for this visit. He reports that he continues to feel well without any new or concerning symptoms. His appetite is stable and denies any weight loss. He denies nausea, vomiting or abdominal pain. His bowel habits are unchanged without any recurrent episodes of diarrhea or constipation. He denies easy bruising or signs of bleeding. He denies any worsening neuropathy or nail changes.  He denies fevers, chills, sweats, shortness of breath, chest pain or cough. He has no other complaints.    Medications: Reviewed without changes. Current Outpatient Medications  Medication Sig Dispense Refill   Calcium Carbonate (CALCIUM 600 PO) Take by mouth daily.     Cholecalciferol (VITAMIN D3) 25 MCG (1000 UT) CAPS Take by mouth.     ferrous sulfate 324 MG TBEC Take 324 mg by mouth daily.     HYDROcodone-acetaminophen (NORCO) 5-325 MG tablet Take 1 tablet by mouth every 6 (six) hours as needed for moderate  pain. (Patient not taking: Reported on 12/24/2021) 60 tablet 0   metFORMIN (GLUCOPHAGE XR) 500 MG 24 hr tablet Take one nightly before bed. 90 tablet 2   Multiple Vitamin (MULTIVITAMIN) capsule Take 1 capsule by mouth daily. Multi vitamin with iron     oxyCODONE (OXY IR/ROXICODONE) 5 MG immediate release tablet Take 5 mg by mouth every 8 (eight) hours as needed. (Patient not taking: Reported on 12/24/2021)     pantoprazole (PROTONIX) 40 MG tablet Take 1 tablet by mouth once daily 90 tablet 0   prochlorperazine (COMPAZINE) 10 MG tablet Take 1 tablet (10 mg total) by mouth every 6 (six) hours as needed for nausea or vomiting. 30 tablet 0   tamsulosin (FLOMAX) 0.4 MG CAPS capsule Take 1 capsule (0.4 mg total) by mouth daily. 30 capsule 3   No current facility-administered medications for this visit.     Allergies: No Known Allergies  REVIEW OF SYSTEMS:   Constitutional: Negative for appetite change, fatigue, chills, fever and unexpected weight change HENT: Negative for mouth sores, nosebleeds, sore throat and trouble swallowing.   Eyes: Negative for eye problems and icterus.  Respiratory: Negative for cough, hemoptysis, shortness of breath and wheezing.   Cardiovascular: Negative for chest pain and leg swelling.  Gastrointestinal: Negative for abdominal pain, constipation, diarrhea, nausea and vomiting.  Genitourinary: Negative for bladder incontinence, difficulty urinating, dysuria, frequency and hematuria.   Musculoskeletal: Negative for back pain, gait problem, neck pain and neck stiffness.  Skin:Negative for rash and ulcers Neurological: Negative for dizziness, extremity  weakness, gait problem, headaches, light-headedness and seizures.  Hematological: Negative for adenopathy. Does not bruise/bleed easily.  Psychiatric/Behavioral: Negative for confusion, depression and sleep disturbance. The patient is not nervous/anxious.     Physical Exam: Blood pressure 125/82, pulse 81, temperature  97.7 F (36.5 C), temperature source Temporal, resp. rate 15, SpO2 100 %.  ECOG: 1 General appearance: Comfortable appearing without any discomfort Head: Normocephalic without any trauma Eyes: Pupils are equal and round reactive to light. Lymph nodes: No cervical, supraclavicular lymphadenopathy.   Heart:regular rate and rhythm.  S1 and S2 without leg edema. Lung: Clear without any rhonchi or wheezes.  No dullness to percussion. Musculoskeletal: No joint deformity or effusion.  Neurological: No focal deficits.  Skin: No petechial rash or dryness.  Appeared moist.    Lab Results: Lab Results  Component Value Date   WBC 5.0 01/28/2022   HGB 10.5 (L) 01/28/2022   HCT 33.7 (L) 01/28/2022   MCV 74.6 (L) 01/28/2022   PLT 439 (H) 01/28/2022   PSA 3,535.93 (H) 08/13/2021     Chemistry      Component Value Date/Time   NA 139 01/28/2022 1046   K 4.1 01/28/2022 1046   CL 105 01/28/2022 1046   CO2 30 01/28/2022 1046   BUN 13 01/28/2022 1046   CREATININE 0.75 01/28/2022 1046   CREATININE 0.76 08/13/2021 1519      Component Value Date/Time   CALCIUM 9.7 01/28/2022 1046   ALKPHOS 85 01/28/2022 1046   AST 21 01/28/2022 1046   ALT 18 01/28/2022 1046   BILITOT 0.3 01/28/2022 1046      Latest Reference Range & Units 08/13/21 15:19 11/05/21 09:12 11/26/21 11:19 12/17/21 09:48  PSA < OR = 4.00 ng/mL 3,535.93 (H)     Prostate Specific Ag, Serum 0.0 - 4.0 ng/mL  173.0 (H) 61.3 (H) 52.5 (H)  (H): Data is abnormally high   Impression and Plan:   71 year old with:  1.  Advanced prostate cancer with disease to the bone and lymphadenopathy diagnosed in March 2023.  He has castration-sensitive disease. --He is currently on Taxotere chemotherapy --Labs from today were reviewed and adequate for treatment. Hgb stable at 10.5, mild thrombocytosis with Plt 439K. CMP unremarkable. PSA marker is continuing to improve measuring 34.1 ng/mL. --Recommend to continue with cycle 5 of Taxotere  chemotherapy as planned. The plan is to complete 6 cycles of therapy and add abiraterone at that time.  He is agreeable to proceed at this time.   2.  IV access: Peripheral veins are currently in use at this time.  No need for Port-A-Cath.   3.  Antiemetics: Compazine is available to him without any nausea or vomiting.   4.  Androgen deprivation therapy: His next injection will be in November 2023 under the care of [facility].  I recommended continuing this indefinitely.   5.  Bone directed therapy: Delton See will be considered after obtaining dental clearance.  I recommended calcium and vitamin D supplements in the meantime.  6.  Vasovagal syncope/seizure: Resolved at this time without any evidence of seizure activity.  He was evaluated by neurology and felt most likely syncope rather than seizure.  7.  Microcytic anemia:  --Iron panel was checked today without any evidence of deficiency --Likely secondary to chemotherapy. Continue to monitor.    Follow-up:  RTC in 3 weeks for labs, f/u visit with Dr. Alen Blew before Cycle 6 of Taxotere chemotherapy  I have spent a total of 30 minutes minutes of face-to-face and  non-face-to-face time, preparing to see the patient,  performing a medically appropriate examination, counseling and educating the patient, ordering tests, documenting clinical information in the electronic health record, and care coordination.   Dede Query PA-C Dept of Hematology and [place] at [facility] Phone: [phone]    7/23/20238:39 PM

## 2022-01-31 ENCOUNTER — Other Ambulatory Visit: Payer: Self-pay

## 2022-01-31 ENCOUNTER — Inpatient Hospital Stay: Payer: Medicare HMO

## 2022-01-31 ENCOUNTER — Telehealth: Payer: Self-pay

## 2022-01-31 VITALS — BP 125/86 | HR 103 | Temp 98.2°F | Resp 16

## 2022-01-31 DIAGNOSIS — C7951 Secondary malignant neoplasm of bone: Secondary | ICD-10-CM | POA: Diagnosis not present

## 2022-01-31 DIAGNOSIS — C61 Malignant neoplasm of prostate: Secondary | ICD-10-CM | POA: Diagnosis not present

## 2022-01-31 DIAGNOSIS — D509 Iron deficiency anemia, unspecified: Secondary | ICD-10-CM | POA: Diagnosis not present

## 2022-01-31 DIAGNOSIS — Z5111 Encounter for antineoplastic chemotherapy: Secondary | ICD-10-CM | POA: Diagnosis not present

## 2022-01-31 DIAGNOSIS — Z5189 Encounter for other specified aftercare: Secondary | ICD-10-CM | POA: Diagnosis not present

## 2022-01-31 MED ORDER — PEGFILGRASTIM-JMDB 6 MG/0.6ML ~~LOC~~ SOSY
6.0000 mg | PREFILLED_SYRINGE | Freq: Once | SUBCUTANEOUS | Status: AC
  Administered 2022-01-31: 6 mg via SUBCUTANEOUS
  Filled 2022-01-31: qty 0.6

## 2022-01-31 NOTE — Telephone Encounter (Signed)
-----   Message from Lincoln Brigham, PA-C sent at 01/31/2022  1:46 PM EDT ----- Please notify patient that PSA levels are coming down.

## 2022-01-31 NOTE — Telephone Encounter (Signed)
Pt's wife, Claudette, advised.

## 2022-02-04 NOTE — Progress Notes (Deleted)
Cardiology Office Note   Date:  02/04/2022   ID:  Steven Jensen, DOB 01-07-1951, MRN [id number]  PCP:  Libby Maw, MD  Cardiologist:   Lugenia Assefa Martinique, MD   No chief complaint on file.     History of Present Illness: Steven Jensen is a 71 y.o. male who is seen at the request of Dr Ethelene Hal for evaluation of syncope.     Past Medical History:  Diagnosis Date   Cancer [facility])     Past Surgical History:  Procedure Laterality Date   EYE SURGERY       Current Outpatient Medications  Medication Sig Dispense Refill   Calcium Carbonate (CALCIUM 600 PO) Take by mouth daily.     Cholecalciferol (VITAMIN D3) 25 MCG (1000 UT) CAPS Take by mouth.     ferrous sulfate 324 MG TBEC Take 324 mg by mouth daily.     HYDROcodone-acetaminophen (NORCO) 5-325 MG tablet Take 1 tablet by mouth every 6 (six) hours as needed for moderate pain. (Patient not taking: Reported on 12/24/2021) 60 tablet 0   metFORMIN (GLUCOPHAGE XR) 500 MG 24 hr tablet Take one nightly before bed. 90 tablet 2   Multiple Vitamin (MULTIVITAMIN) capsule Take 1 capsule by mouth daily. Multi vitamin with iron     oxyCODONE (OXY IR/ROXICODONE) 5 MG immediate release tablet Take 5 mg by mouth every 8 (eight) hours as needed. (Patient not taking: Reported on 12/24/2021)     pantoprazole (PROTONIX) 40 MG tablet Take 1 tablet by mouth once daily 90 tablet 0   prochlorperazine (COMPAZINE) 10 MG tablet Take 1 tablet (10 mg total) by mouth every 6 (six) hours as needed for nausea or vomiting. 30 tablet 0   tamsulosin (FLOMAX) 0.4 MG CAPS capsule Take 1 capsule (0.4 mg total) by mouth daily. 30 capsule 3   No current facility-administered medications for this visit.    Allergies:   Patient has no known allergies.    Social History:  The patient  reports that he has never smoked. He has never used smokeless tobacco. He reports that he does not drink alcohol and does not use drugs.   Family History:  The patient's  ***family history includes Cancer in his brother, mother, and sister; Hypertension in his brother and mother.    ROS:  Please see the history of present illness.   Otherwise, review of systems are positive for {NONE DEFAULTED:18576}.   All other systems are reviewed and negative.    PHYSICAL EXAM: VS:  There were no vitals taken for this visit. , BMI There is no height or weight on file to calculate BMI. GEN: Well nourished, well developed, in no acute distress HEENT: normal Neck: no JVD, carotid bruits, or masses Cardiac: ***RRR; no murmurs, rubs, or gallops,no edema  Respiratory:  clear to auscultation bilaterally, normal work of breathing GI: soft, nontender, nondistended, + BS MS: no deformity or atrophy Skin: warm and dry, no rash Neuro:  Strength and sensation are intact Psych: euthymic mood, full affect   EKG:  EKG {ACTION; IS/IS [id number]} ordered today. The ekg ordered today demonstrates ***   Recent Labs: 11/26/2021: Magnesium 2.0 01/28/2022: ALT 18; BUN 13; Creatinine 0.75; Hemoglobin 10.5; Platelet Count 439; Potassium 4.1; Sodium 139    Lipid Panel    Component Value Date/Time   LDLDIRECT 116 (H) 08/13/2021 1519      Wt Readings from Last 3 Encounters:  01/28/22 164 lb 4 oz (74.5 kg)  01/07/22 162 lb 8  oz (73.7 kg)  12/24/21 161 lb (73 kg)      Other studies Reviewed: Additional studies/ records that were reviewed today include: ***. Review of the above records demonstrates: ***   ASSESSMENT AND PLAN:  1.  ***   Current medicines are reviewed at length with the patient today.  The patient {ACTIONS; HAS/DOES NOT HAVE:19233} concerns regarding medicines.  The following changes have been made:  {PLAN; NO CHANGE:13088:s}  Labs/ tests ordered today include: *** No orders of the defined types were placed in this encounter.        Disposition:   FU with *** in {gen number 3-09:407680} {Days to years:10300}  Signed, Gavino Fouch Martinique, MD  02/04/2022  4:34 PM    [facility] [address] [place], [place], [address] Phone [phone], Fax [phone]

## 2022-02-09 ENCOUNTER — Other Ambulatory Visit: Payer: Self-pay

## 2022-02-11 ENCOUNTER — Encounter: Payer: Self-pay | Admitting: Family Medicine

## 2022-02-11 ENCOUNTER — Ambulatory Visit (INDEPENDENT_AMBULATORY_CARE_PROVIDER_SITE_OTHER): Payer: Medicare HMO | Admitting: Family Medicine

## 2022-02-11 ENCOUNTER — Ambulatory Visit: Payer: Medicare HMO | Admitting: Cardiology

## 2022-02-11 VITALS — BP 120/68 | HR 85 | Temp 97.0°F | Ht 69.0 in | Wt 164.4 lb

## 2022-02-11 DIAGNOSIS — D509 Iron deficiency anemia, unspecified: Secondary | ICD-10-CM

## 2022-02-11 DIAGNOSIS — E119 Type 2 diabetes mellitus without complications: Secondary | ICD-10-CM | POA: Diagnosis not present

## 2022-02-11 DIAGNOSIS — E782 Mixed hyperlipidemia: Secondary | ICD-10-CM | POA: Diagnosis not present

## 2022-02-11 LAB — LIPID PANEL
Cholesterol: 183 mg/dL (ref 0–200)
HDL: 39.1 mg/dL (ref >39.00)
LDL Cholesterol: 122 mg/dL — ABNORMAL HIGH (ref 0–99)
NonHDL: 144.11
Total CHOL/HDL Ratio: 5
Triglycerides: 111 mg/dL (ref 0.0–149.0)
VLDL: 22.2 mg/dL (ref 0.0–40.0)

## 2022-02-11 LAB — URINALYSIS, ROUTINE W REFLEX MICROSCOPIC
Bilirubin Urine: NEGATIVE
Hgb urine dipstick: NEGATIVE
Ketones, ur: NEGATIVE
Leukocytes,Ua: NEGATIVE
Nitrite: NEGATIVE
Specific Gravity, Urine: 1.015 (ref 1.000–1.030)
Total Protein, Urine: NEGATIVE
Urine Glucose: NEGATIVE
Urobilinogen, UA: 0.2 (ref 0.0–1.0)
pH: 6.5 (ref 5.0–8.0)

## 2022-02-11 LAB — HEMOGLOBIN A1C: Hgb A1c MFr Bld: 6.8 % — ABNORMAL HIGH (ref 4.6–6.5)

## 2022-02-11 LAB — MICROALBUMIN / CREATININE URINE RATIO
Creatinine,U: 72.3 mg/dL
Microalb Creat Ratio: 1 mg/g (ref 0.0–30.0)
Microalb, Ur: <0.7 mg/dL (ref 0.0–1.9)

## 2022-02-11 NOTE — Progress Notes (Signed)
Established Patient Office Visit  Subjective   Patient ID: Steven Jensen, male    DOB: 11-Jan-1951  Age: 71 y.o. MRN: [id number]  Chief Complaint  Patient presents with   Follow-up    3 month follow up, no concerns. Patient fasting.    HPI for follow-up of type 2 diabetes, mixed hyperlipidemia.  Currently undergoing treatment for metastatic prostate disease.  He has just started a new or antiandrogen medication, Nubeqa '300mg'$  qid.  His PSA is fallen from over 3000-35.  We will finish chemotherapy in a few weeks.  Doing well.  Feels well.  Is actually gained a little weight.  He has never had a colonoscopy.  Review of blood work taken by hematology oncology shows a microcytic anemia.    Review of Systems  Constitutional: Negative.   HENT: Negative.    Eyes:  Negative for blurred vision, discharge and redness.  Respiratory: Negative.    Cardiovascular: Negative.   Gastrointestinal:  Negative for abdominal pain, blood in stool, constipation, diarrhea and melena.  Genitourinary: Negative.   Musculoskeletal: Negative.  Negative for myalgias.  Skin:  Negative for rash.  Neurological:  Negative for tingling, loss of consciousness and weakness.  Endo/Heme/Allergies:  Negative for polydipsia.      Objective:     BP 120/68 (BP Location: Right Arm, Patient Position: Sitting, Cuff Size: Normal)   Pulse 85   Temp (!) 97 F (36.1 C) (Temporal)   Ht '5\' 9"'$  (1.753 m)   Wt 164 lb 6.4 oz (74.6 kg)   SpO2 97%   BMI 24.28 kg/m  Wt Readings from Last 3 Encounters:  02/11/22 164 lb 6.4 oz (74.6 kg)  01/28/22 164 lb 4 oz (74.5 kg)  01/07/22 162 lb 8 oz (73.7 kg)      Physical Exam Constitutional:      General: He is not in acute distress.    Appearance: Normal appearance. He is not ill-appearing, toxic-appearing or diaphoretic.  HENT:     Head: Normocephalic and atraumatic.     Right Ear: External ear normal.     Left Ear: External ear normal.     Mouth/Throat:     Mouth: Mucous  membranes are moist.     Pharynx: Oropharynx is clear. No oropharyngeal exudate or posterior oropharyngeal erythema.  Eyes:     General: No scleral icterus.       Right eye: No discharge.        Left eye: No discharge.     Extraocular Movements: Extraocular movements intact.     Conjunctiva/sclera: Conjunctivae normal.     Pupils: Pupils are equal, round, and reactive to light.  Cardiovascular:     Rate and Rhythm: Normal rate and regular rhythm.  Pulmonary:     Effort: Pulmonary effort is normal. No respiratory distress.     Breath sounds: Normal breath sounds.  Abdominal:     General: Bowel sounds are normal.  Musculoskeletal:     Cervical back: No rigidity or tenderness.  Skin:    General: Skin is warm and dry.  Neurological:     Mental Status: He is alert and oriented to person, place, and time.  Psychiatric:        Mood and Affect: Mood normal.        Behavior: Behavior normal.      No results found for any visits on 02/11/22.    The ASCVD Risk score (Arnett DK, et al., 2019) failed to calculate for the following reasons:  Cannot find a previous HDL lab   Cannot find a previous total cholesterol lab    Assessment & Plan:   Problem List Items Addressed This Visit       Endocrine   Type 2 diabetes mellitus without complication, without long-term current use of insulin (HCC)   Relevant Orders   Hemoglobin A1c   Urinalysis, Routine w reflex microscopic   Microalbumin / creatinine urine ratio     Other   Microcytic anemia - Primary   Relevant Orders   Ambulatory referral to Gastroenterology   Other Visit Diagnoses     Mixed hyperlipidemia       Relevant Orders   Lipid panel       Return in about 3 months (around 05/14/2022).   Referral: To GI for colonoscopy.  Further recommendations made pending results of today's blood work. Libby Maw, MD

## 2022-02-12 ENCOUNTER — Other Ambulatory Visit: Payer: Self-pay

## 2022-02-21 ENCOUNTER — Ambulatory Visit: Payer: Medicare HMO

## 2022-02-21 ENCOUNTER — Ambulatory Visit: Payer: Medicare HMO | Admitting: Oncology

## 2022-02-21 ENCOUNTER — Encounter: Payer: Self-pay | Admitting: Cardiology

## 2022-02-21 ENCOUNTER — Other Ambulatory Visit: Payer: Medicare HMO

## 2022-02-21 NOTE — Telephone Encounter (Signed)
error 

## 2022-02-23 ENCOUNTER — Ambulatory Visit: Payer: Medicare HMO

## 2022-02-24 MED FILL — Dexamethasone Sodium Phosphate Inj 100 MG/10ML: INTRAMUSCULAR | Qty: 1 | Status: AC

## 2022-02-25 ENCOUNTER — Inpatient Hospital Stay: Payer: Medicare HMO | Admitting: Oncology

## 2022-02-25 ENCOUNTER — Other Ambulatory Visit: Payer: Self-pay

## 2022-02-25 ENCOUNTER — Inpatient Hospital Stay: Payer: Medicare HMO | Attending: Oncology

## 2022-02-25 ENCOUNTER — Inpatient Hospital Stay: Payer: Medicare HMO

## 2022-02-25 VITALS — BP 144/94 | HR 77 | Temp 97.7°F | Resp 15 | Wt 167.3 lb

## 2022-02-25 DIAGNOSIS — C7951 Secondary malignant neoplasm of bone: Secondary | ICD-10-CM | POA: Insufficient documentation

## 2022-02-25 DIAGNOSIS — Z5189 Encounter for other specified aftercare: Secondary | ICD-10-CM | POA: Diagnosis not present

## 2022-02-25 DIAGNOSIS — Z5111 Encounter for antineoplastic chemotherapy: Secondary | ICD-10-CM | POA: Diagnosis not present

## 2022-02-25 DIAGNOSIS — C61 Malignant neoplasm of prostate: Secondary | ICD-10-CM | POA: Diagnosis not present

## 2022-02-25 LAB — CBC WITH DIFFERENTIAL (CANCER CENTER ONLY)
Abs Immature Granulocytes: 0.01 10*3/uL (ref 0.00–0.07)
Basophils Absolute: 0 10*3/uL (ref 0.0–0.1)
Basophils Relative: 1 %
Eosinophils Absolute: 0.3 10*3/uL (ref 0.0–0.5)
Eosinophils Relative: 7 %
HCT: 33.3 % — ABNORMAL LOW (ref 39.0–52.0)
Hemoglobin: 10.3 g/dL — ABNORMAL LOW (ref 13.0–17.0)
Immature Granulocytes: 0 %
Lymphocytes Relative: 25 %
Lymphs Abs: 1 10*3/uL (ref 0.7–4.0)
MCH: 23 pg — ABNORMAL LOW (ref 26.0–34.0)
MCHC: 30.9 g/dL (ref 30.0–36.0)
MCV: 74.5 fL — ABNORMAL LOW (ref 80.0–100.0)
Monocytes Absolute: 0.4 10*3/uL (ref 0.1–1.0)
Monocytes Relative: 11 %
Neutro Abs: 2.4 10*3/uL (ref 1.7–7.7)
Neutrophils Relative %: 56 %
Platelet Count: 462 10*3/uL — ABNORMAL HIGH (ref 150–400)
RBC: 4.47 MIL/uL (ref 4.22–5.81)
RDW: 16.1 % — ABNORMAL HIGH (ref 11.5–15.5)
WBC Count: 4.2 10*3/uL (ref 4.0–10.5)
nRBC: 0 % (ref 0.0–0.2)

## 2022-02-25 LAB — CMP (CANCER CENTER ONLY)
ALT: 15 U/L (ref 0–44)
AST: 20 U/L (ref 15–41)
Albumin: 3.7 g/dL (ref 3.5–5.0)
Alkaline Phosphatase: 79 U/L (ref 38–126)
Anion gap: 3 — ABNORMAL LOW (ref 5–15)
BUN: 12 mg/dL (ref 8–23)
CO2: 30 mmol/L (ref 22–32)
Calcium: 9.6 mg/dL (ref 8.9–10.3)
Chloride: 108 mmol/L (ref 98–111)
Creatinine: 0.76 mg/dL (ref 0.61–1.24)
GFR, Estimated: >60 mL/min (ref >60)
Glucose, Bld: 66 mg/dL — ABNORMAL LOW (ref 70–99)
Potassium: 3.9 mmol/L (ref 3.5–5.1)
Sodium: 141 mmol/L (ref 135–145)
Total Bilirubin: 0.4 mg/dL (ref 0.3–1.2)
Total Protein: 6.4 g/dL — ABNORMAL LOW (ref 6.5–8.1)

## 2022-02-25 MED ORDER — SODIUM CHLORIDE 0.9 % IV SOLN: Freq: Once | INTRAVENOUS | Status: AC

## 2022-02-25 MED ORDER — SODIUM CHLORIDE 0.9 % IV SOLN
75.0000 mg/m2 | Freq: Once | INTRAVENOUS | Status: AC
  Administered 2022-02-25: 130 mg via INTRAVENOUS
  Filled 2022-02-25: qty 13

## 2022-02-25 MED ORDER — SODIUM CHLORIDE 0.9 % IV SOLN
10.0000 mg | Freq: Once | INTRAVENOUS | Status: AC
  Administered 2022-02-25: 10 mg via INTRAVENOUS
  Filled 2022-02-25: qty 10

## 2022-02-25 NOTE — Patient Instructions (Signed)
[facility]  Discharge Instructions: Thank you for choosing [facility] to provide your oncology and hematology care.   If you have a lab appointment with the [facility], please go directly to the [facility] and check in at the registration area.   Wear comfortable clothing and clothing appropriate for easy access to any Portacath or PICC line.   We strive to give you quality time with your provider. You may need to reschedule your appointment if you arrive late (15 or more minutes).  Arriving late affects you and other patients whose appointments are after yours.  Also, if you miss three or more appointments without notifying the office, you may be dismissed from the clinic at the provider's discretion.      For prescription refill requests, have your pharmacy contact our office and allow 72 hours for refills to be completed.    Today you received the following chemotherapy and/or immunotherapy agent: Docetaxel      To help prevent nausea and vomiting after your treatment, we encourage you to take your nausea medication as directed.  BELOW ARE SYMPTOMS THAT SHOULD BE REPORTED IMMEDIATELY: *FEVER GREATER THAN 100.4 F (38 C) OR HIGHER *CHILLS OR SWEATING *NAUSEA AND VOMITING THAT IS NOT CONTROLLED WITH YOUR NAUSEA MEDICATION *UNUSUAL SHORTNESS OF BREATH *UNUSUAL BRUISING OR BLEEDING *URINARY PROBLEMS (pain or burning when urinating, or frequent urination) *BOWEL PROBLEMS (unusual diarrhea, constipation, pain near the anus) TENDERNESS IN MOUTH AND THROAT WITH OR WITHOUT PRESENCE OF ULCERS (sore throat, sores in mouth, or a toothache) UNUSUAL RASH, SWELLING OR PAIN  UNUSUAL VAGINAL DISCHARGE OR ITCHING   Items with * indicate a potential emergency and should be followed up as soon as possible or go to the Emergency Department if any problems should occur.  Please show the CHEMOTHERAPY ALERT CARD or IMMUNOTHERAPY ALERT CARD at check-in to  the Emergency Department and triage nurse.  Should you have questions after your visit or need to cancel or reschedule your appointment, please contact [facility]  Dept: [phone]  and follow the prompts.  Office hours are 8:00 a.m. to 4:30 p.m. Monday - Friday. Please note that voicemails left after 4:00 p.m. may not be returned until the following business day.  We are closed weekends and major holidays. You have access to a nurse at all times for urgent questions. Please call the main number to the clinic Dept: [phone] and follow the prompts.   For any non-urgent questions, you may also contact your provider using MyChart. We now offer e-Visits for anyone 6 and older to request care online for non-urgent symptoms. For details visit [email or web address].   Also download the MyChart app! Go to the app store, search "MyChart", open the app, select [facility], and log in with your MyChart username and password.  Masks are optional in the cancer centers. If you would like for your care team to wear a mask while they are taking care of you, please let them know. You may have one support person who is at least 72 years old accompany you for your appointments.

## 2022-02-25 NOTE — Progress Notes (Signed)
Hematology and Oncology Follow Up Visit  Steven Jensen [id number] [birth year] 71 y.o. 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  He presented with castration-sensitive disease, Gleason score of 9 and a PSA of 3500   Prior Therapy: He is status post prostate biopsy completed in February 2023 which confirmed the presence of Gleason score 9 prostate cancer in 6 of all 12 cores   Current therapy:   Androgen deprivation therapy under the care of Dr. Claudia Desanctis.  He received Eligard 45 mg in May 2023.  Taxotere chemotherapy started November 05, 2021 with 75 mg per metered square.  He completed cycle 1 of therapy without complication and cycle 2 was interrupted due to seizure activity.  He is here for cycle 6 of therapy.   Interim History: Steven Jensen returns today for a follow-up.  Since last visit, he reports feeling well without any major complaints.  He denies any nausea, vomiting or abdominal pain.  He denies any worsening neuropathy or fatigue.  He denies any hospitalizations or illnesses.    Medications: Updated on review. Current Outpatient Medications  Medication Sig Dispense Refill   Calcium Carbonate (CALCIUM 600 PO) Take by mouth daily.     Cholecalciferol (VITAMIN D3) 25 MCG (1000 UT) CAPS Take by mouth.     ferrous sulfate 324 MG TBEC Take 324 mg by mouth daily.     metFORMIN (GLUCOPHAGE XR) 500 MG 24 hr tablet Take one nightly before bed. 90 tablet 2   Multiple Vitamin (MULTIVITAMIN) capsule Take 1 capsule by mouth daily. Multi vitamin with iron     pantoprazole (PROTONIX) 40 MG tablet Take 1 tablet by mouth once daily 90 tablet 0   prochlorperazine (COMPAZINE) 10 MG tablet Take 1 tablet (10 mg total) by mouth every 6 (six) hours as needed for nausea or vomiting. 30 tablet 0   tamsulosin (FLOMAX) 0.4 MG CAPS capsule Take  1 capsule (0.4 mg total) by mouth daily. 30 capsule 3   No current facility-administered medications for this visit.     Allergies: No Known Allergies    Physical Exam:   Blood pressure (!) 144/94, pulse 77, temperature 97.7 F (36.5 C), temperature source Oral, resp. rate 15, weight 167 lb 4.8 oz (75.9 kg), SpO2 100 %.   ECOG: 1    General appearance: Alert, awake without any distress. Head: Atraumatic without abnormalities Oropharynx: Without any thrush or ulcers. Eyes: No scleral icterus. Lymph nodes: No lymphadenopathy noted in the cervical, supraclavicular, or axillary nodes Heart:regular rate and rhythm, without any murmurs or gallops.   Lung: Clear to auscultation without any rhonchi, wheezes or dullness to percussion. Abdomin: Soft, nontender without any shifting dullness or ascites. Musculoskeletal: No clubbing or cyanosis. Neurological: No motor or sensory deficits. Skin: No rashes or lesions.     Lab Results: Lab Results  Component Value Date   WBC 5.0 01/28/2022   HGB 10.5 (L) 01/28/2022   HCT 33.7 (L) 01/28/2022   MCV 74.6 (L) 01/28/2022   PLT 439 (H) 01/28/2022   PSA 3,535.93 (H) 08/13/2021     Chemistry      Component Value Date/Time   NA 139 01/28/2022 1046   K 4.1 01/28/2022 1046   CL 105 01/28/2022 1046   CO2 30 01/28/2022 1046   BUN 13 01/28/2022 1046   CREATININE 0.75 01/28/2022 1046   CREATININE 0.76 08/13/2021 1519  Component Value Date/Time   CALCIUM 9.7 01/28/2022 1046   ALKPHOS 85 01/28/2022 1046   AST 21 01/28/2022 1046   ALT 18 01/28/2022 1046   BILITOT 0.3 01/28/2022 1046      Latest Reference Range & Units 11/05/21 09:12 11/26/21 11:19 12/17/21 09:48 01/07/22 10:14 01/28/22 10:46  Prostate Specific Ag, Serum 0.0 - 4.0 ng/mL 173.0 (H) 61.3 (H) 52.5 (H) 42.0 (H) 34.1 (H)  (H): Data is abnormally high  Impression and Plan:   71 year old with:  1.  Castration-sensitive advanced prostate cancer with disease to the  bone and lymphadenopathy diagnosed in March 2023.     The natural course of his disease and treatment options were discussed at this time.  He is currently on Taxotere chemotherapy which she has tolerated very well.  Risks and benefits of proceeding with cycle 6 were discussed.  Complications that include nausea, vomiting, myelosuppression, neutropenia and possible sepsis were reiterated.  Plan is to complete cycle 6 of therapy and subsequently proceed with darolutamide.  Complications associated with this treatment include nausea, fatigue, edema and hypertension.  He is prescribed this medication via [facility] which I recommended to start taking it in the immediate future.   2.  IV access: Port-A-Cath option has been deferred at this time.   3.  Antiemetics: No nausea or vomiting reported at this time.  Compazine is available to him.   4.  Androgen deprivation therapy: He continues to receive androgen deprivation under the care of [facility].  This will be continued indefinitely.   5.  Bone directed therapy: I recommended obtaining dental clearance and continue calcium vitamin D supplements before proceeding with Xgeva.  6.  Vasovagal syncope/seizure: No recurrence noted at this time.   7.  Follow-up: He will return in the next 8 weeks for repeat follow-up.   30  minutes were dedicated to this visit.  The time was spent on reviewing laboratory data, disease status update and outlining future plan of care discussion.     Zola Button, MD 8/18/20238:09 AM

## 2022-02-26 LAB — PROSTATE-SPECIFIC AG, SERUM (LABCORP): Prostate Specific Ag, Serum: 33.7 ng/mL — ABNORMAL HIGH (ref 0.0–4.0)

## 2022-02-28 ENCOUNTER — Inpatient Hospital Stay: Payer: Medicare HMO

## 2022-02-28 VITALS — BP 114/76 | HR 89 | Temp 98.7°F | Resp 20

## 2022-02-28 DIAGNOSIS — C61 Malignant neoplasm of prostate: Secondary | ICD-10-CM | POA: Diagnosis not present

## 2022-02-28 DIAGNOSIS — Z5111 Encounter for antineoplastic chemotherapy: Secondary | ICD-10-CM | POA: Diagnosis not present

## 2022-02-28 DIAGNOSIS — C7951 Secondary malignant neoplasm of bone: Secondary | ICD-10-CM | POA: Diagnosis not present

## 2022-02-28 DIAGNOSIS — Z5189 Encounter for other specified aftercare: Secondary | ICD-10-CM | POA: Diagnosis not present

## 2022-02-28 MED ORDER — PEGFILGRASTIM-JMDB 6 MG/0.6ML ~~LOC~~ SOSY
6.0000 mg | PREFILLED_SYRINGE | Freq: Once | SUBCUTANEOUS | Status: AC
  Administered 2022-02-28: 6 mg via SUBCUTANEOUS
  Filled 2022-02-28: qty 0.6

## 2022-03-08 ENCOUNTER — Telehealth: Payer: Self-pay | Admitting: Family Medicine

## 2022-03-08 NOTE — Telephone Encounter (Signed)
Left message for patient to call back and schedule Medicare Annual Wellness Visit (AWV).   Please offer to do virtually or by telephone.  Left office number and my jabber #[phone].  AWVI eligible as of  08/12/2015  Please schedule at anytime with Nurse Health Advisor.   

## 2022-03-09 DIAGNOSIS — C7951 Secondary malignant neoplasm of bone: Secondary | ICD-10-CM | POA: Diagnosis not present

## 2022-03-09 DIAGNOSIS — N5201 Erectile dysfunction due to arterial insufficiency: Secondary | ICD-10-CM | POA: Diagnosis not present

## 2022-03-09 DIAGNOSIS — C61 Malignant neoplasm of prostate: Secondary | ICD-10-CM | POA: Diagnosis not present

## 2022-03-16 ENCOUNTER — Telehealth: Payer: Self-pay | Admitting: Family Medicine

## 2022-03-16 NOTE — Telephone Encounter (Signed)
Appointment scheduled for virtual 

## 2022-03-16 NOTE — Telephone Encounter (Signed)
Pts wife called and asked if you would call him on his cell I have updated his #. He is feeling covid symptoms feels bad.

## 2022-03-17 ENCOUNTER — Telehealth: Payer: Medicare HMO | Admitting: Family Medicine

## 2022-04-06 ENCOUNTER — Other Ambulatory Visit: Payer: Self-pay | Admitting: Family Medicine

## 2022-04-06 DIAGNOSIS — K2960 Other gastritis without bleeding: Secondary | ICD-10-CM

## 2022-05-03 ENCOUNTER — Other Ambulatory Visit: Payer: Self-pay

## 2022-05-04 ENCOUNTER — Ambulatory Visit (INDEPENDENT_AMBULATORY_CARE_PROVIDER_SITE_OTHER): Payer: Medicare HMO

## 2022-05-04 VITALS — Ht 69.0 in | Wt 162.0 lb

## 2022-05-04 DIAGNOSIS — Z0000 Encounter for general adult medical examination without abnormal findings: Secondary | ICD-10-CM

## 2022-05-04 NOTE — Patient Instructions (Signed)
[name] , Thank you for taking time to come for your Medicare Wellness Visit. I appreciate your ongoing commitment to your health goals. Please review the following plan we discussed and let me know if I can assist you in the future.   These are the goals we discussed:  Goals      Exercise 3x per week (30 min per time)        This is a list of the screening recommended for you and due dates:  Health Maintenance  Topic Date Due   Complete foot exam   Never done   Eye exam for diabetics  08/17/2021   Colon Cancer Screening  08/31/2022*   Hemoglobin A1C  08/14/2022   Yearly kidney health urinalysis for diabetes  02/12/2023   Yearly kidney function blood test for diabetes  02/26/2023   Medicare Annual Wellness Visit  06/04/2023   Hepatitis C Screening: USPSTF Recommendation to screen - Ages 18-79 yo.  Completed   HPV Vaccine  Aged Out   Pneumonia Vaccine  Discontinued   Flu Shot  Discontinued   Tetanus Vaccine  Discontinued   COVID-19 Vaccine  Discontinued   Zoster (Shingles) Vaccine  Discontinued  *Topic was postponed. The date shown is not the original due date.    Advanced directives: Advance directive discussed with you today. I have provided a copy for you to complete at home and have notarized. Once this is complete please bring a copy in to our office so we can scan it into your chart.   Conditions/risks identified: Aim for 30 minutes of exercise or brisk walking, 6-8 glasses of water, and 5 servings of fruits and vegetables each day.   Next appointment: Follow up in one year for your annual wellness visit.   Preventive Care 42 Years and Older, Male  Preventive care refers to lifestyle choices and visits with your health care provider that can promote health and wellness. What does preventive care include? A yearly physical exam. This is also called an annual well check. Dental exams once or twice a year. Routine eye exams. Ask your health care provider how often you  should have your eyes checked. Personal lifestyle choices, including: Daily care of your teeth and gums. Regular physical activity. Eating a healthy diet. Avoiding tobacco and drug use. Limiting alcohol use. Practicing safe sex. Taking low doses of aspirin every day. Taking vitamin and mineral supplements as recommended by your health care provider. What happens during an annual well check? The services and screenings done by your health care provider during your annual well check will depend on your age, overall health, lifestyle risk factors, and family history of disease. Counseling  Your health care provider may ask you questions about your: Alcohol use. Tobacco use. Drug use. Emotional well-being. Home and relationship well-being. Sexual activity. Eating habits. History of falls. Memory and ability to understand (cognition). Work and work Statistician. Screening  You may have the following tests or measurements: Height, weight, and BMI. Blood pressure. Lipid and cholesterol levels. These may be checked every 5 years, or more frequently if you are over 83 years old. Skin check. Lung cancer screening. You may have this screening every year starting at age 72 if you have a 30-pack-year history of smoking and currently smoke or have quit within the past 15 years. Fecal occult blood test (FOBT) of the stool. You may have this test every year starting at age 72. Flexible sigmoidoscopy or colonoscopy. You may have a sigmoidoscopy every  5 years or a colonoscopy every 10 years starting at age 72. Prostate cancer screening. Recommendations will vary depending on your family history and other risks. Hepatitis C blood test. Hepatitis B blood test. Sexually transmitted disease (STD) testing. Diabetes screening. This is done by checking your blood sugar (glucose) after you have not eaten for a while (fasting). You may have this done every 1-3 years. Abdominal aortic aneurysm (AAA)  screening. You may need this if you are a current or former smoker. Osteoporosis. You may be screened starting at age 39 if you are at high risk. Talk with your health care provider about your test results, treatment options, and if necessary, the need for more tests. Vaccines  Your health care provider may recommend certain vaccines, such as: Influenza vaccine. This is recommended every year. Tetanus, diphtheria, and acellular pertussis (Tdap, Td) vaccine. You may need a Td booster every 10 years. Zoster vaccine. You may need this after age 72. Pneumococcal 13-valent conjugate (PCV13) vaccine. One dose is recommended after age 72. Pneumococcal polysaccharide (PPSV23) vaccine. One dose is recommended after age 72. Talk to your health care provider about which screenings and vaccines you need and how often you need them. This information is not intended to replace advice given to you by your health care provider. Make sure you discuss any questions you have with your health care provider. Document Released: 07/24/2015 Document Revised: 03/16/2016 Document Reviewed: 04/28/2015 Elsevier Interactive Patient Education  2017 [employer] Prevention in the Home Falls can cause injuries. They can happen to people of all ages. There are many things you can do to make your home safe and to help prevent falls. What can I do on the outside of my home? Regularly fix the edges of walkways and driveways and fix any cracks. Remove anything that might make you trip as you walk through a door, such as a raised step or threshold. Trim any bushes or trees on the path to your home. Use bright outdoor lighting. Clear any walking paths of anything that might make someone trip, such as rocks or tools. Regularly check to see if handrails are loose or broken. Make sure that both sides of any steps have handrails. Any raised decks and porches should have guardrails on the edges. Have any leaves, snow, or ice  cleared regularly. Use sand or salt on walking paths during winter. Clean up any spills in your garage right away. This includes oil or grease spills. What can I do in the bathroom? Use night lights. Install grab bars by the toilet and in the tub and shower. Do not use towel bars as grab bars. Use non-skid mats or decals in the tub or shower. If you need to sit down in the shower, use a plastic, non-slip stool. Keep the floor dry. Clean up any water that spills on the floor as soon as it happens. Remove soap buildup in the tub or shower regularly. Attach bath mats securely with double-sided non-slip rug tape. Do not have throw rugs and other things on the floor that can make you trip. What can I do in the bedroom? Use night lights. Make sure that you have a light by your bed that is easy to reach. Do not use any sheets or blankets that are too big for your bed. They should not hang down onto the floor. Have a firm chair that has side arms. You can use this for support while you get dressed. Do not have throw  rugs and other things on the floor that can make you trip. What can I do in the kitchen? Clean up any spills right away. Avoid walking on wet floors. Keep items that you use a lot in easy-to-reach places. If you need to reach something above you, use a strong step stool that has a grab bar. Keep electrical cords out of the way. Do not use floor polish or wax that makes floors slippery. If you must use wax, use non-skid floor wax. Do not have throw rugs and other things on the floor that can make you trip. What can I do with my stairs? Do not leave any items on the stairs. Make sure that there are handrails on both sides of the stairs and use them. Fix handrails that are broken or loose. Make sure that handrails are as long as the stairways. Check any carpeting to make sure that it is firmly attached to the stairs. Fix any carpet that is loose or worn. Avoid having throw rugs at the  top or bottom of the stairs. If you do have throw rugs, attach them to the floor with carpet tape. Make sure that you have a light switch at the top of the stairs and the bottom of the stairs. If you do not have them, ask someone to add them for you. What else can I do to help prevent falls? Wear shoes that: Do not have high heels. Have rubber bottoms. Are comfortable and fit you well. Are closed at the toe. Do not wear sandals. If you use a stepladder: Make sure that it is fully opened. Do not climb a closed stepladder. Make sure that both sides of the stepladder are locked into place. Ask someone to hold it for you, if possible. Clearly mark and make sure that you can see: Any grab bars or handrails. First and last steps. Where the edge of each step is. Use tools that help you move around (mobility aids) if they are needed. These include: Canes. Walkers. Scooters. Crutches. Turn on the lights when you go into a dark area. Replace any light bulbs as soon as they burn out. Set up your furniture so you have a clear path. Avoid moving your furniture around. If any of your floors are uneven, fix them. If there are any pets around you, be aware of where they are. Review your medicines with your doctor. Some medicines can make you feel dizzy. This can increase your chance of falling. Ask your doctor what other things that you can do to help prevent falls. This information is not intended to replace advice given to you by your health care provider. Make sure you discuss any questions you have with your health care provider. Document Released: 04/23/2009 Document Revised: 12/03/2015 Document Reviewed: 08/01/2014 Elsevier Interactive Patient Education  2017 [employer].

## 2022-05-04 NOTE — Progress Notes (Signed)
Subjective:   Kaesen Rodriguez is a 71 y.o. male who presents for Medicare Annual/Subsequent preventive examination.  I connected with  Altamese Cabal on 05/04/22 by a audio enabled telemedicine application and verified that I am speaking with the correct person using two identifiers.  Patient Location: Home  Provider Location: Home Office  I discussed the limitations of evaluation and management by telemedicine. The patient expressed understanding and agreed to proceed.  Nutrition Risk Assessment:  Has the patient had any N/V/D within the last 2 months?  No  Does the patient have any non-healing wounds?  No  Has the patient had any unintentional weight loss or weight gain?  No   Diabetes:  Is the patient diabetic?  Yes  If diabetic, was a CBG obtained today?  No  Did the patient bring in their glucometer from home?  No  How often do you monitor your CBG's? Never .   Financial Strains and Diabetes Management:  Are you having any financial strains with the device, your supplies or your medication? No .  Does the patient want to be seen by Chronic Care Management for management of their diabetes?  No  Would the patient like to be referred to a Nutritionist or for Diabetic Management?  No   Diabetic Exams:  Diabetic Eye Exam: Overdue for diabetic eye exam. Pt has been advised about the importance in completing this exam. Patient advised to call and schedule an eye exam. Diabetic Foot Exam: Overdue, Pt has been advised about the importance in completing this exam. Pt is scheduled for diabetic foot exam on next office visit .  Review of Systems     Cardiac Risk Factors include: advanced age (>36mn, >>61women);diabetes mellitus;hypertension;male gender     Objective:    Today's Vitals   05/04/22 1205  Weight: 162 lb (73.5 kg)  Height: '5\' 9"'$  (1.753 m)   Body mass index is 23.92 kg/m.     05/04/2022   12:08 PM 12/24/2021   10:38 AM 11/26/2021   12:27 PM  Advanced  Directives  Does Patient Have a Medical Advance Directive? No No No  Would patient like information on creating a medical advance directive? No - Patient declined      Current Medications (verified) Outpatient Encounter Medications as of 05/04/2022  Medication Sig   Calcium Carbonate (CALCIUM 600 PO) Take by mouth daily.   Cholecalciferol (VITAMIN D3) 25 MCG (1000 UT) CAPS Take by mouth.   ferrous sulfate 324 MG TBEC Take 324 mg by mouth daily.   metFORMIN (GLUCOPHAGE XR) 500 MG 24 hr tablet Take one nightly before bed.   Multiple Vitamin (MULTIVITAMIN) capsule Take 1 capsule by mouth daily. Multi vitamin with iron   pantoprazole (PROTONIX) 40 MG tablet Take 1 tablet by mouth once daily   prochlorperazine (COMPAZINE) 10 MG tablet Take 1 tablet (10 mg total) by mouth every 6 (six) hours as needed for nausea or vomiting.   tamsulosin (FLOMAX) 0.4 MG CAPS capsule Take 1 capsule (0.4 mg total) by mouth daily.   No facility-administered encounter medications on file as of 05/04/2022.    Allergies (verified) Patient has no known allergies.   History: Past Medical History:  Diagnosis Date   Cancer ([facility]    Past Surgical History:  Procedure Laterality Date   EYE SURGERY     Family History  Problem Relation Age of Onset   Cancer Mother    Hypertension Mother    Cancer Sister    Cancer Brother  Hypertension Brother    Social History   Socioeconomic History   Marital status: Married    Spouse name: Not on file   Number of children: Not on file   Years of education: Not on file   Highest education level: Not on file  Occupational History   Not on file  Tobacco Use   Smoking status: Never   Smokeless tobacco: Never  Vaping Use   Vaping Use: Never used  Substance and Sexual Activity   Alcohol use: Never   Drug use: Never   Sexual activity: Yes  Other Topics Concern   Not on file  Social History Narrative   Right handed lives with wife    Social Determinants of  Health   Financial Resource Strain: Low Risk  (05/04/2022)   Overall Financial Resource Strain (CARDIA)    Difficulty of Paying Living Expenses: Not hard at all  Food Insecurity: No Food Insecurity (05/04/2022)   Hunger Vital Sign    Worried About Running Out of Food in the Last Year: Never true    [place] in the Last Year: Never true  Transportation Needs: No Transportation Needs (05/04/2022)   PRAPARE - Hydrologist (Medical): No    Lack of Transportation (Non-Medical): No  Physical Activity: Sufficiently Active (05/04/2022)   Exercise Vital Sign    Days of Exercise per Week: 5 days    Minutes of Exercise per Session: 30 min  Stress: No Stress Concern Present (05/04/2022)   [place]    Feeling of Stress : Not at all  Social Connections: Moderately Integrated (05/04/2022)   Social Connection and Isolation Panel [NHANES]    Frequency of Communication with Friends and Family: More than three times a week    Frequency of Social Gatherings with Friends and Family: More than three times a week    Attends Religious Services: More than 4 times per year    Active Member of [employer] or Organizations: No    Attends Music therapist: Never    Marital Status: Married    Tobacco Counseling Counseling given: Not Answered   Clinical Intake:  Pre-visit preparation completed: Yes  Pain : No/denies pain     Nutritional Risks: None Diabetes: No  How often do you need to have someone help you when you read instructions, pamphlets, or other written materials from your doctor or pharmacy?: 1 - Never  Diabetic?yes   Interpreter Needed?: No  Information entered by :: Jadene Pierini, LPN   Activities of Daily Living    05/04/2022   12:08 PM  In your present state of health, do you have any difficulty performing the following activities:  Hearing? 0  Vision? 0   Difficulty concentrating or making decisions? 0  Walking or climbing stairs? 0  Dressing or bathing? 0  Doing errands, shopping? 0  Preparing Food and eating ? N  Using the Toilet? N  In the past six months, have you accidently leaked urine? N  Do you have problems with loss of bowel control? N  Managing your Medications? N  Managing your Finances? N  Housekeeping or managing your Housekeeping? N    Patient Care Team: Libby Maw, MD as PCP - General (Family Medicine) Katheren Puller, RN as Oncology Nurse Navigator Delice Lesch, Lezlie Octave, MD as Consulting Physician (Neurology)  Indicate any recent Medical Services you may have received from other than [facility] providers in the  past year (date may be approximate).     Assessment:   This is a routine wellness examination for [place].  Hearing/Vision screen Vision Screening - Comments:: Due eye exam patient to call schedule   Dietary issues and exercise activities discussed: Current Exercise Habits: Home exercise routine, Type of exercise: walking, Time (Minutes): 30, Frequency (Times/Week): 5, Weekly Exercise (Minutes/Week): 150, Intensity: Mild, Exercise limited by: None identified   Goals Addressed             This Visit's Progress    Exercise 3x per week (30 min per time)         Depression Screen    05/04/2022   12:08 PM 02/11/2022   11:26 AM 09/17/2021    2:37 PM 08/13/2021    3:47 PM 08/13/2021    2:21 PM  PHQ 2/9 Scores  PHQ - 2 Score 0 0 0 0 0  PHQ- 9 Score    1     Fall Risk    05/04/2022   12:07 PM 02/11/2022   11:26 AM 12/24/2021   10:37 AM 09/17/2021    2:37 PM 08/13/2021    2:21 PM  Fall Risk   Falls in the past year? 0 0 0 0 0  Number falls in past yr: 0 0 0 0 0  Injury with Fall? 0  0 0   Risk for fall due to : No Fall Risks      Follow up Falls prevention discussed        FALL RISK PREVENTION PERTAINING TO THE HOME:  Any stairs in or around the home? No  If so, are there any without handrails?  No  Home free of loose throw rugs in walkways, pet beds, electrical cords, etc? Yes  Adequate lighting in your home to reduce risk of falls? Yes   ASSISTIVE DEVICES UTILIZED TO PREVENT FALLS:  Life alert? No  Use of a cane, walker or w/c? No  Grab bars in the bathroom? No  Shower chair or bench in shower? No  Elevated toilet seat or a handicapped toilet? No        05/04/2022   12:09 PM  6CIT Screen  What Year? 0 points  What month? 0 points  What time? 0 points  Count back from 20 0 points  Months in reverse 0 points  Repeat phrase 0 points  Total Score 0 points    Immunizations Immunization History  Administered Date(s) Administered   Moderna Sars-Covid-2 Vaccination 10/24/2019, 11/18/2019    TDAP status: Due, Education has been provided regarding the importance of this vaccine. Advised may receive this vaccine at local pharmacy or Health Dept. Aware to provide a copy of the vaccination record if obtained from local pharmacy or Health Dept. Verbalized acceptance and understanding.  Flu Vaccine status: Due, Education has been provided regarding the importance of this vaccine. Advised may receive this vaccine at local pharmacy or Health Dept. Aware to provide a copy of the vaccination record if obtained from local pharmacy or Health Dept. Verbalized acceptance and understanding.  Pneumococcal vaccine status: Due, Education has been provided regarding the importance of this vaccine. Advised may receive this vaccine at local pharmacy or Health Dept. Aware to provide a copy of the vaccination record if obtained from local pharmacy or Health Dept. Verbalized acceptance and understanding.  Covid-19 vaccine status: Completed vaccines  Qualifies for Shingles Vaccine? Yes   Zostavax completed No   Shingrix Completed?: No.    Education has been provided  regarding the importance of this vaccine. Patient has been advised to call insurance company to determine out of pocket expense if  they have not yet received this vaccine. Advised may also receive vaccine at local pharmacy or Health Dept. Verbalized acceptance and understanding.  Screening Tests Health Maintenance  Topic Date Due   FOOT EXAM  Never done   OPHTHALMOLOGY EXAM  08/17/2021   COLONOSCOPY (Pts 45-11yr Insurance coverage will need to be confirmed)  08/31/2022 (Originally 09/07/1995)   HEMOGLOBIN A1C  08/14/2022   Diabetic kidney evaluation - Urine ACR  02/12/2023   Diabetic kidney evaluation - GFR measurement  02/26/2023   Medicare Annual Wellness (AWV)  06/04/2023   Hepatitis C Screening  Completed   HPV VACCINES  Aged Out   Pneumonia Vaccine 72 Years old  Discontinued   INFLUENZA VACCINE  Discontinued   TETANUS/TDAP  Discontinued   COVID-19 Vaccine  Discontinued   Zoster Vaccines- Shingrix  Discontinued    Health Maintenance  Health Maintenance Due  Topic Date Due   FOOT EXAM  Never done   OPHTHALMOLOGY EXAM  08/17/2021    Colorectal cancer screening: Referral to GI placed patient wants discuss with PCP. Pt aware the office will call re: appt.  Lung Cancer Screening: (Low Dose CT Chest recommended if Age 72-80years, 30 pack-year currently smoking OR have quit w/in 15years.) does not qualify.   Lung Cancer Screening Referral: n/a  Additional Screening:  Hepatitis C Screening: does not qualify;   Vision Screening: Recommended annual ophthalmology exams for early detection of glaucoma and other disorders of the eye. Is the patient up to date with their annual eye exam?  No  Who is the provider or what is the name of the office in which the patient attends annual eye exams? None patient to schedule  If pt is not established with a provider, would they like to be referred to a provider to establish care? No .   Dental Screening: Recommended annual dental exams for proper oral hygiene  Community Resource Referral / Chronic Care Management: CRR required this visit?  No   CCM required this  visit?  No      Plan:     I have personally reviewed and noted the following in the patient's chart:   Medical and social history Use of alcohol, tobacco or illicit drugs  Current medications and supplements including opioid prescriptions. Patient is not currently taking opioid prescriptions. Functional ability and status Nutritional status Physical activity Advanced directives List of other physicians Hospitalizations, surgeries, and ER visits in previous 12 months Vitals Screenings to include cognitive, depression, and falls Referrals and appointments  In addition, I have reviewed and discussed with patient certain preventive protocols, quality metrics, and best practice recommendations. A written personalized care plan for preventive services as well as general preventive health recommendations were provided to patient.     LDaphane Shepherd LPN   [id number]  Nurse Notes: Patient to discuss Colonoscopy with PCP

## 2022-05-10 ENCOUNTER — Other Ambulatory Visit: Payer: Self-pay

## 2022-05-13 ENCOUNTER — Ambulatory Visit: Payer: Medicare HMO | Admitting: Family Medicine

## 2022-05-20 ENCOUNTER — Ambulatory Visit: Payer: Medicare HMO | Admitting: Family Medicine

## 2022-05-22 ENCOUNTER — Other Ambulatory Visit: Payer: Self-pay

## 2022-06-01 ENCOUNTER — Telehealth: Payer: Self-pay | Admitting: Neurology

## 2022-06-01 NOTE — Telephone Encounter (Signed)
Called pt wife and informed her that none of our providers give him meds. She said it was an infusion possibly the cancer Dr. And informed her to call the cancer Dr. She understood and said that he could of possibly got the wrong Dr.

## 2022-06-01 NOTE — Telephone Encounter (Signed)
Pt's wife called in stating the pt has been having a rash across his temples and sharp pains from his temple to the front of his forehead. He thinks it's associated with medication. She did not know the name of the medication, but says it comes to him through the mail.

## 2022-06-03 ENCOUNTER — Other Ambulatory Visit: Payer: Self-pay

## 2022-06-03 ENCOUNTER — Observation Stay (HOSPITAL_COMMUNITY)
Admission: EM | Admit: 2022-06-03 | Discharge: 2022-06-04 | Disposition: A | Discharge status: Home / Self Care | Payer: Medicare HMO | Attending: Internal Medicine | Admitting: Internal Medicine

## 2022-06-03 ENCOUNTER — Encounter (HOSPITAL_COMMUNITY): Payer: Self-pay | Admitting: Internal Medicine

## 2022-06-03 DIAGNOSIS — B020 Zoster encephalitis: Secondary | ICD-10-CM | POA: Diagnosis not present

## 2022-06-03 DIAGNOSIS — I10 Essential (primary) hypertension: Secondary | ICD-10-CM | POA: Diagnosis not present

## 2022-06-03 DIAGNOSIS — Z8546 Personal history of malignant neoplasm of prostate: Secondary | ICD-10-CM | POA: Insufficient documentation

## 2022-06-03 DIAGNOSIS — B0230 Zoster ocular disease, unspecified: Secondary | ICD-10-CM | POA: Diagnosis not present

## 2022-06-03 DIAGNOSIS — Z79899 Other long term (current) drug therapy: Secondary | ICD-10-CM | POA: Insufficient documentation

## 2022-06-03 DIAGNOSIS — Z7984 Long term (current) use of oral hypoglycemic drugs: Secondary | ICD-10-CM | POA: Insufficient documentation

## 2022-06-03 DIAGNOSIS — R21 Rash and other nonspecific skin eruption: Secondary | ICD-10-CM | POA: Diagnosis present

## 2022-06-03 DIAGNOSIS — E119 Type 2 diabetes mellitus without complications: Secondary | ICD-10-CM | POA: Insufficient documentation

## 2022-06-03 DIAGNOSIS — B029 Zoster without complications: Secondary | ICD-10-CM | POA: Insufficient documentation

## 2022-06-03 DIAGNOSIS — C61 Malignant neoplasm of prostate: Secondary | ICD-10-CM | POA: Diagnosis present

## 2022-06-03 DIAGNOSIS — K219 Gastro-esophageal reflux disease without esophagitis: Secondary | ICD-10-CM | POA: Insufficient documentation

## 2022-06-03 LAB — CBC WITH DIFFERENTIAL/PLATELET
Abs Immature Granulocytes: 0.01 10*3/uL (ref 0.00–0.07)
Basophils Absolute: 0 10*3/uL (ref 0.0–0.1)
Basophils Relative: 1 %
Eosinophils Absolute: 0 10*3/uL (ref 0.0–0.5)
Eosinophils Relative: 1 %
HCT: 40.2 % (ref 39.0–52.0)
Hemoglobin: 12.2 g/dL — ABNORMAL LOW (ref 13.0–17.0)
Immature Granulocytes: 0 %
Lymphocytes Relative: 34 %
Lymphs Abs: 1.5 10*3/uL (ref 0.7–4.0)
MCH: 22.8 pg — ABNORMAL LOW (ref 26.0–34.0)
MCHC: 30.3 g/dL (ref 30.0–36.0)
MCV: 75.3 fL — ABNORMAL LOW (ref 80.0–100.0)
Monocytes Absolute: 0.5 10*3/uL (ref 0.1–1.0)
Monocytes Relative: 11 %
Neutro Abs: 2.3 10*3/uL (ref 1.7–7.7)
Neutrophils Relative %: 53 %
Platelets: 308 10*3/uL (ref 150–400)
RBC: 5.34 MIL/uL (ref 4.22–5.81)
RDW: 14.3 % (ref 11.5–15.5)
WBC: 4.3 10*3/uL (ref 4.0–10.5)
nRBC: 0 % (ref 0.0–0.2)

## 2022-06-03 LAB — BASIC METABOLIC PANEL
Anion gap: 6 (ref 5–15)
BUN: 19 mg/dL (ref 8–23)
CO2: 25 mmol/L (ref 22–32)
Calcium: 9.6 mg/dL (ref 8.9–10.3)
Chloride: 102 mmol/L (ref 98–111)
Creatinine, Ser: 0.87 mg/dL (ref 0.61–1.24)
GFR, Estimated: >60 mL/min (ref >60)
Glucose, Bld: 106 mg/dL — ABNORMAL HIGH (ref 70–99)
Potassium: 3.9 mmol/L (ref 3.5–5.1)
Sodium: 133 mmol/L — ABNORMAL LOW (ref 135–145)

## 2022-06-03 LAB — GLUCOSE, CAPILLARY
Glucose-Capillary: 112 mg/dL — ABNORMAL HIGH (ref 70–99)
Glucose-Capillary: 108 mg/dL — ABNORMAL HIGH (ref 70–99)

## 2022-06-03 MED ORDER — ACETAMINOPHEN 325 MG PO TABS
650.0000 mg | ORAL_TABLET | Freq: Four times a day (QID) | ORAL | Status: DC | PRN
  Filled 2022-06-03: qty 2

## 2022-06-03 MED ORDER — DAROLUTAMIDE 300 MG PO TABS: 600.0000 mg | ORAL_TABLET | Freq: Two times a day (BID) | ORAL | Status: DC

## 2022-06-03 MED ORDER — DEXTROSE 5 % IV SOLN
10.0000 mg/kg | INTRAVENOUS | Status: AC
  Administered 2022-06-03: 740 mg via INTRAVENOUS
  Filled 2022-06-03: qty 14.8

## 2022-06-03 MED ORDER — PROCHLORPERAZINE EDISYLATE 10 MG/2ML IJ SOLN: 10.0000 mg | Freq: Four times a day (QID) | INTRAMUSCULAR | Status: DC | PRN

## 2022-06-03 MED ORDER — HYDROCODONE-ACETAMINOPHEN 5-325 MG PO TABS
1.0000 | ORAL_TABLET | ORAL | Status: DC | PRN
  Administered 2022-06-03 – 2022-06-04 (×4): 2 via ORAL
  Filled 2022-06-03 (×4): qty 2

## 2022-06-03 MED ORDER — INSULIN ASPART 100 UNIT/ML IJ SOLN
0.0000 [IU] | Freq: Three times a day (TID) | INTRAMUSCULAR | Status: DC
  Administered 2022-06-04: 1 [IU] via SUBCUTANEOUS

## 2022-06-03 MED ORDER — LACTATED RINGERS IV SOLN: INTRAVENOUS | Status: DC

## 2022-06-03 MED ORDER — METOCLOPRAMIDE HCL 5 MG/ML IJ SOLN
10.0000 mg | Freq: Once | INTRAMUSCULAR | Status: AC
  Administered 2022-06-03: 10 mg via INTRAVENOUS
  Filled 2022-06-03: qty 2

## 2022-06-03 MED ORDER — DEXTROSE 5 % IV SOLN
10.0000 mg/kg | Freq: Three times a day (TID) | INTRAVENOUS | Status: DC
  Administered 2022-06-03 – 2022-06-04 (×3): 740 mg via INTRAVENOUS
  Filled 2022-06-03 (×4): qty 14.8

## 2022-06-03 MED ORDER — TAMSULOSIN HCL 0.4 MG PO CAPS
0.8000 mg | ORAL_CAPSULE | Freq: Every day | ORAL | Status: DC
  Administered 2022-06-03: 0.8 mg via ORAL
  Filled 2022-06-03: qty 2

## 2022-06-03 MED ORDER — ACETAMINOPHEN 500 MG PO TABS
1000.0000 mg | ORAL_TABLET | Freq: Once | ORAL | Status: AC
  Administered 2022-06-03: 1000 mg via ORAL
  Filled 2022-06-03: qty 2

## 2022-06-03 MED ORDER — ACETAMINOPHEN 650 MG RE SUPP: 650.0000 mg | Freq: Four times a day (QID) | RECTAL | Status: DC | PRN

## 2022-06-03 MED ORDER — PANTOPRAZOLE SODIUM 40 MG PO TBEC
40.0000 mg | DELAYED_RELEASE_TABLET | Freq: Every day | ORAL | Status: DC
  Administered 2022-06-03: 40 mg via ORAL
  Filled 2022-06-03: qty 1

## 2022-06-03 NOTE — Consult Note (Signed)
[name] for Infectious Disease    Date of Admission:  06/03/2022     Reason for Consult: zoster    Referring Provider: Marylyn Ishihara     Abx: 11/24-c acyclovir        Assessment: 71 yo male bph, dm2, hx prostate cancer admitted 11/24 for concern zoster with left ophthalmic cn5 distribution and headache  He has no clinical sx of meningitis or encephalitis and there is no sign of [place] syndrome  He has hx left cataract surgery and he doesn't see out of that eye. He reports slight discomfort out of the eye and there is mild conjuntivitis there  He has had sx for almost a week. Clinically he appeared very well otherwise  I think if we can confirm with ophthalmology tomorrow there is no need for any topical antibiotics prophylaxis he could discharge and have outpatient follow up with them   Plan: Tomorrow could transition to valtrex 1 gram tid and finish 10 day treatment Advise patient to get zoster adjuvant vaccine via PCP office when able [facility] to discharge from id standpoint F/u ophthalmology to monitor for eye complication Will sign off Discussed with primary team   I spent 75 minute reviewing data/chart, and coordinating care and >50% direct face to face time providing counseling/discussing diagnostics/treatment plan with patient   ------------------------------------------------ Principal Problem:   Herpes zoster ophthalmicus of left eye Active Problems:   Type 2 diabetes mellitus without complication, without long-term current use of insulin (HCC)   Prostate cancer ([place])   Shingles   GERD (gastroesophageal reflux disease)    HPI: Steven Jensen is a 71 y.o. male bph, dm2, hx prostate cancer admitted 11/24 for concern zoster with left ophthalmic cn5 distribution and headache  He has been well when suddenly develop pain and rash left forehead. Rash become vessicular and crusted. Has headache but reports focally left head  No confusion No  f/c No prior zoster  He has hx left cataract surgery and he doesn't see out of that eye. He reports slight discomfort out of the eye and there is mild conjuntivitis there  No fever/leukocytosis on presentation Given location and eye involvement and age admitted for observation Started on iv acyclovir  No other sx  Family History  Problem Relation Age of Onset   Cancer Mother    Hypertension Mother    Cancer Sister    Cancer Brother    Hypertension Brother     Social History   Tobacco Use   Smoking status: Never   Smokeless tobacco: Never  Vaping Use   Vaping Use: Never used  Substance Use Topics   Alcohol use: Never   Drug use: Never    No Known Allergies  Review of Systems: ROS All Other ROS was negative, except mentioned above   Past Medical History:  Diagnosis Date   Cancer ([place])        Scheduled Meds:  insulin aspart  0-9 Units Subcutaneous TID WC   pantoprazole  40 mg Oral QHS   tamsulosin  0.8 mg Oral QHS   Continuous Infusions:  acyclovir (ZOVIRAX) 740 mg in dextrose 5 % 250 mL IVPB     lactated ringers     PRN Meds:.acetaminophen **OR** acetaminophen, HYDROcodone-acetaminophen, prochlorperazine   OBJECTIVE: Blood pressure (!) 150/94, pulse 92, temperature 99.1 F (37.3 C), resp. rate 18, height 5' 9.5" (1.765 m), weight 73.9 kg, SpO2 100 %.  Physical Exam  General/constitutional: no distress,  pleasant HEENT: Normocephalic, left conj injection and slight mucoid discharge along eye lid; slight eye lid/periorbital edema (chronically can't see due to prior cataract failed surgeries) Neck supple CV: rrr no mrg Lungs: clear to auscultation, normal respiratory effort Abd: Soft, Nontender Ext: no edema Skin: vessicular and crusting rash left cn5 at ophthalmic distribution Neuro: nonfocal; no meningismus MSK: no peripheral joint swelling/tenderness/warmth; back spines nontender     Lab Results Lab Results  Component Value Date   WBC 4.3  06/03/2022   HGB 12.2 (L) 06/03/2022   HCT 40.2 06/03/2022   MCV 75.3 (L) 06/03/2022   PLT 308 06/03/2022    Lab Results  Component Value Date   CREATININE 0.87 06/03/2022   BUN 19 06/03/2022   NA 133 (L) 06/03/2022   K 3.9 06/03/2022   CL 102 06/03/2022   CO2 25 06/03/2022    Lab Results  Component Value Date   ALT 15 02/25/2022   AST 20 02/25/2022   ALKPHOS 79 02/25/2022   BILITOT 0.4 02/25/2022      Microbiology: No results found for this or any previous visit (from the past 240 hour(s)).   Serology:    Imaging: If present, new imagings (plain films, ct scans, and mri) have been personally visualized and interpreted; radiology reports have been reviewed. Decision making incorporated into the Impression / Recommendations.    Jabier Mutton, [facility] [place] [phone] pager    06/03/2022, 5:13 PM

## 2022-06-03 NOTE — ED Triage Notes (Signed)
Pt presents with isolated rash to left forehead and eye. Raised bumps without weeping for about a week. Headache and itching noted

## 2022-06-03 NOTE — Progress Notes (Signed)
Pharmacy Antibiotic Note  Steven Jensen is a 71 y.o. male admitted on 06/03/2022 with  Herpes Zosyn, r/o encephalitis .  Pharmacy has been consulted for Acyclovir dosing.  Active Problem(s): rash to left forehead and eye. Raised bumps without weeping for about a week. Headache and itching noted, burning - Headaches are constant, he is taken Tylenol and Excedrin without any relief.  No seizure-like activity.  Headaches are positional, worse when he leans forward.   PMH: DM, HTN  ID: Zoster but r/o herpes encephalitis. ID said  LP was not needed, as the PCR likely will be positive and they will just treat him as zoster encephalitis right now. . - Afebrile, WBC 4.3, Scr <1  Acyclovir 11/24>>  Plan: Acyclovir '740mg'$  IV x 1 in ED then '10mg'$ /kg/8hrs.      Height: 5' 9.5" (176.5 cm) Weight: 73.9 kg (163 lb) IBW/kg (Calculated) : 71.85  Temp (24hrs), Avg:98 F (36.7 C), Min:97.6 F (36.4 C), Max:98.4 F (36.9 C)  Recent Labs  Lab 06/03/22 0935  WBC 4.3  CREATININE 0.87    Estimated Creatinine Clearance: 78.1 mL/min (by C-G formula based on SCr of 0.87 mg/dL).    No Known Allergies   Aashika Carta S. Alford Highland, PharmD, BCPS Clinical Staff Pharmacist [email or web address] [place] 06/03/2022 10:55 AM

## 2022-06-03 NOTE — ED Notes (Signed)
Chat sent to [facility] RN for pt to come to room.

## 2022-06-03 NOTE — H&P (Signed)
History and Physical    Patient: Steven Jensen TKZ:[id number] DOB: [birth year] DOA: 06/03/2022 DOS: the patient was seen and examined on 06/03/2022 PCP: Steven Maw, MD  Patient coming from: Home  Chief Complaint:  Chief Complaint  Patient presents with   Rash   HPI: Steven Jensen is a 71 y.o. male with medical history significant of prostate CA, anemia, GERD, DM2. Presenting with left facial rash. He noticed a rash begin on his left forehead and around his left eye 5 to 6 days ago. It has been itchy and painful. He has had headaches. He has tried OTC meds but they have not helped. His eye has been a little more red, but he has not noticed and visual changes. When his symptoms did not improve this morning, he decided to come to the ED for assistance. He denies any other aggravating or alleviating factors.    Review of Systems: As mentioned in the history of present illness. All other systems reviewed and are negative. Past Medical History:  Diagnosis Date   Cancer [facility])    Past Surgical History:  Procedure Laterality Date   EYE SURGERY     Social History:  reports that he has never smoked. He has never used smokeless tobacco. He reports that he does not drink alcohol and does not use drugs.  No Known Allergies  Family History  Problem Relation Age of Onset   Cancer Mother    Hypertension Mother    Cancer Sister    Cancer Brother    Hypertension Brother     Prior to Admission medications   Medication Sig Start Date End Date Taking? Authorizing Provider  Calcium Carbonate (CALCIUM 600 PO) Take by mouth daily.    [provider]  Cholecalciferol (VITAMIN D3) 25 MCG (1000 UT) CAPS Take by mouth.    [provider]  ferrous sulfate 324 MG TBEC Take 324 mg by mouth daily.    [provider]  metFORMIN (GLUCOPHAGE XR) 500 MG 24 hr tablet Take one nightly before bed. 09/17/21   Steven Maw, MD  Multiple Vitamin (MULTIVITAMIN)  capsule Take 1 capsule by mouth daily. Multi vitamin with iron    [provider]  pantoprazole (PROTONIX) 40 MG tablet Take 1 tablet by mouth once daily 04/07/22   Steven Maw, MD  prochlorperazine (COMPAZINE) 10 MG tablet Take 1 tablet (10 mg total) by mouth every 6 (six) hours as needed for nausea or vomiting. 10/22/21   Wyatt Portela, MD  tamsulosin (FLOMAX) 0.4 MG CAPS capsule Take 1 capsule (0.4 mg total) by mouth daily. 08/13/21   Steven Maw, MD    Physical Exam: Vitals:   06/03/22 0736 06/03/22 0744 06/03/22 1048  BP: (!) 141/92  127/78  Pulse: 92  93  Resp: 15  18  Temp: 98.4 F (36.9 C)  97.6 F (36.4 C)  TempSrc: Oral    SpO2: 98%  100%  Weight:  73.9 kg   Height:  5' 9.5" (1.765 m)    General: 71 y.o. male resting in bed in NAD Eyes: PERRL, swelling around the eyes, tearing ENMT: Nares patent w/o discharge, orophaynx clear, dentition normal, ears w/o discharge/lesions/ulcers Neck: Supple, trachea midline Cardiovascular: RRR, +S1, S2, no m/g/r, equal pulses throughout Respiratory: CTABL, no w/r/r, normal WOB GI: BS+, NDNT, no masses noted, no organomegaly noted MSK: No e/c/c; macular rash along left forehead and around left eye w/ some scabbing and swelling Neuro: A&O x 3, no focal deficits  Psyc: Appropriate interaction and affect, calm/cooperative  Data Reviewed:  Results for orders placed or performed during the hospital encounter of 06/03/22 (from the past 24 hour(s))  Basic metabolic panel     Status: Abnormal   Collection Time: 06/03/22  9:35 AM  Result Value Ref Range   Sodium 133 (L) 135 - 145 mmol/L   Potassium 3.9 3.5 - 5.1 mmol/L   Chloride 102 98 - 111 mmol/L   CO2 25 22 - 32 mmol/L   Glucose, Bld 106 (H) 70 - 99 mg/dL   BUN 19 8 - 23 mg/dL   Creatinine, Ser 0.87 0.61 - 1.24 mg/dL   Calcium 9.6 8.9 - 10.3 mg/dL   GFR, Estimated >60 >60 mL/min   Anion gap 6 5 - 15  CBC with Differential     Status: Abnormal    Collection Time: 06/03/22  9:35 AM  Result Value Ref Range   WBC 4.3 4.0 - 10.5 K/uL   RBC 5.34 4.22 - 5.81 MIL/uL   Hemoglobin 12.2 (L) 13.0 - 17.0 g/dL   HCT 40.2 39.0 - 52.0 %   MCV 75.3 (L) 80.0 - 100.0 fL   MCH 22.8 (L) 26.0 - 34.0 pg   MCHC 30.3 30.0 - 36.0 g/dL   RDW 14.3 11.5 - 15.5 %   Platelets 308 150 - 400 K/uL   nRBC 0.0 0.0 - 0.2 %   Neutrophils Relative % 53 %   Neutro Abs 2.3 1.7 - 7.7 K/uL   Lymphocytes Relative 34 %   Lymphs Abs 1.5 0.7 - 4.0 K/uL   Monocytes Relative 11 %   Monocytes Absolute 0.5 0.1 - 1.0 K/uL   Eosinophils Relative 1 %   Eosinophils Absolute 0.0 0.0 - 0.5 K/uL   Basophils Relative 1 %   Basophils Absolute 0.0 0.0 - 0.1 K/uL   Immature Granulocytes 0 %   Abs Immature Granulocytes 0.01 0.00 - 0.07 K/uL   Assessment and Plan: Shingles flare Zoster ophthalmicus     - place in obs, med-surg     - continue high dose acyclovir     - EDP spoke with ophtho (Dr. Nancy Jensen [facility]); rec'd acyclovir and that he follow up in clinic the day after discharge     - EDP spoke with ID; they rec'd high dose acyclovir and no need for LP; they will follow  DM2     - A1c, SSI, glucose checks, DM diet  Hx of prostate CA     - continue outpt follow up  GERD     - PPI  Advance Care Planning:   Code Status: FULL  Consults: EDP spoke with ophthalmology and ID  Family Communication: w/ wife at bedside  Severity of Illness: The appropriate patient status for this patient is OBSERVATION. Observation status is judged to be reasonable and necessary in order to provide the required intensity of service to ensure the patient's safety. The patient's presenting symptoms, physical exam findings, and initial radiographic and laboratory data in the context of their medical condition is felt to place them at decreased risk for further clinical deterioration. Furthermore, it is anticipated that the patient will be medically stable for discharge from  the hospital within 2 midnights of admission.   Author: Jonnie Finner, DO 06/03/2022 10:56 AM  For on call review [email or web address].

## 2022-06-03 NOTE — ED Notes (Signed)
Dr Marylyn Ishihara in speaking with pt.

## 2022-06-03 NOTE — ED Provider Notes (Signed)
[place] DEPT Provider Note   CSN: [id number] Arrival date & time: 06/03/22  6301     History  Chief Complaint  Patient presents with   Rash    Steven Jensen is a 71 y.o. male.  HPI    71 year old male with history of diabetes, hypertension comes in with chief complaint of headache, facial pain and rash.  Patient started developing a rash around the left side of his forehead and the eye about a week ago.  Thereafter he started having itching sensation and headaches.  The pain around the rash is described as burning pain.  The headache is still left-sided, but closer to the vertex.  He denies any associated nausea, vomiting, fevers, chills, confusion.  Headaches are constant, he is taken Tylenol and Excedrin without any relief.  No seizure-like activity.  Headaches are positional, worse when he leans forward.  Patient indicates having some tearing from his eye and did comfort of his eye.  He does not think his vision is changed dramatically.  He has history of cataract surgery to his left eye.  Home Medications Prior to Admission medications   Medication Sig Start Date End Date Taking? Authorizing Provider  Calcium Carbonate (CALCIUM 600 PO) Take by mouth daily.    [provider]  Cholecalciferol (VITAMIN D3) 25 MCG (1000 UT) CAPS Take by mouth.    [provider]  ferrous sulfate 324 MG TBEC Take 324 mg by mouth daily.    [provider]  metFORMIN (GLUCOPHAGE XR) 500 MG 24 hr tablet Take one nightly before bed. 09/17/21   Libby Maw, MD  Multiple Vitamin (MULTIVITAMIN) capsule Take 1 capsule by mouth daily. Multi vitamin with iron    [provider]  pantoprazole (PROTONIX) 40 MG tablet Take 1 tablet by mouth once daily 04/07/22   Libby Maw, MD  prochlorperazine (COMPAZINE) 10 MG tablet Take 1 tablet (10 mg total) by mouth every 6 (six) hours as needed for nausea or vomiting. 10/22/21    Wyatt Portela, MD  tamsulosin (FLOMAX) 0.4 MG CAPS capsule Take 1 capsule (0.4 mg total) by mouth daily. 08/13/21   Libby Maw, MD      Allergies    Patient has no known allergies.    Review of Systems   Review of Systems  Physical Exam Updated Vital Signs BP (!) 141/92 (BP Location: Left Arm)   Pulse 92   Temp 98.4 F (36.9 C) (Oral)   Resp 15   Ht 5' 9.5" (1.765 m)   Wt 73.9 kg   SpO2 98%   BMI 23.73 kg/m  Physical Exam Vitals and nursing note reviewed.  Constitutional:      Appearance: He is well-developed.  HENT:     Head: Atraumatic.  Eyes:     Extraocular Movements: Extraocular movements intact.     Comments: Gross bedside visual acuity with finger counting is normal  Cardiovascular:     Rate and Rhythm: Normal rate.  Pulmonary:     Effort: Pulmonary effort is normal.  Musculoskeletal:     Cervical back: Neck supple.  Skin:    General: Skin is warm.     Findings: Rash present.     Comments: Multiple macular lesions over the left forehead, some with crusting. Left eye has chemosis No meningismus  Neurological:     Mental Status: He is alert and oriented to person, place, and time.  ED Results / Procedures / Treatments   Labs (all labs ordered are listed, but only abnormal results are displayed) Labs Reviewed  BASIC METABOLIC PANEL - Abnormal; Notable for the following components:      Result Value   Sodium 133 (*)    Glucose, Bld 106 (*)    All other components within normal limits  CBC WITH DIFFERENTIAL/PLATELET - Abnormal; Notable for the following components:   Hemoglobin 12.2 (*)    MCV 75.3 (*)    MCH 22.8 (*)    All other components within normal limits    EKG None  Radiology No results found.  Procedures Procedures    Medications Ordered in ED Medications  metoCLOPramide (REGLAN) injection 10 mg (10 mg Intravenous Given 06/03/22 0932)  acetaminophen (TYLENOL) tablet 1,000 mg (1,000 mg Oral Given  06/03/22 0933)  acyclovir (ZOVIRAX) 740 mg in dextrose 5 % 250 mL IVPB (740 mg Intravenous New Bag/Given 06/03/22 7628)    ED Course/ Medical Decision Making/ A&P                           Medical Decision Making  71 year old patient with history of diabetes comes in with chief complaint of headache, eye discomfort, facial rash with pain.  Differential diagnosis considered includes zoster ophthalmicus, zoster encephalitis, trigeminal neuralgia.  Exam consistent with zoster infection.  Patient's symptoms started more than 72 hours ago.  It appears that some of the lesions are not crusted.  I consulted ophthalmology.  They recommended that patient get systemic treatment, no additional topical medications are needed.  If patient continues to have eye issues, they can follow-up with Dr. Nancy Fetter in her clinic at 8 AM on Monday if discharged today, or he can call for an appointment later in the week.  I consulted infectious disease team.  I spoke with Dr. Hulen L[place].  He recommends that patient be admitted for high-dose IV acyclovir, given his age, comorbidities and the complaint of headaches.  We discussed if we should get LP, Dr. Hulen [place] said that LP was not needed, as the PCR likely will be positive and they will just treat him as zoster encephalitis right now.  He request that we admit to medicine.  He request that we do not give steroids now.  He will see the patient.  Patient made aware of the plan.  Medicine consulted for admission. I have independently reviewed the results of the lab.  The labs are reassuring.  I reviewed patient's outpatient notes.  It appears that he is also seeing oncologist for prostate issues and was getting systemic chemo as late as August.  Final Clinical Impression(s) / ED Diagnoses Final diagnoses:  Herpes zoster ophthalmicus  Zoster encephalitis    Rx / DC Orders ED Discharge Orders     None         Varney Biles, MD 06/03/22 1046

## 2022-06-03 NOTE — Plan of Care (Signed)
  Problem: Education: Goal: Knowledge of General Education information will improve Description: Including pain rating scale, medication(s)/side effects and non-pharmacologic comfort measures Outcome: Progressing   Problem: Coping: Goal: Level of anxiety will decrease Outcome: Progressing   Problem: Pain Managment: Goal: General experience of comfort will improve Outcome: Progressing   Problem: Skin Integrity: Goal: Risk for impaired skin integrity will decrease Outcome: Progressing   

## 2022-06-03 NOTE — Plan of Care (Signed)
  Problem: Education: Goal: Knowledge of General Education information will improve Description: Including pain rating scale, medication(s)/side effects and non-pharmacologic comfort measures Outcome: Progressing   Problem: Health Behavior/Discharge Planning: Goal: Ability to manage health-related needs will improve Outcome: Progressing   Problem: Pain Managment: Goal: General experience of comfort will improve Outcome: Progressing   

## 2022-06-04 DIAGNOSIS — B0230 Zoster ocular disease, unspecified: Secondary | ICD-10-CM | POA: Diagnosis not present

## 2022-06-04 LAB — COMPREHENSIVE METABOLIC PANEL
ALT: 20 U/L (ref 0–44)
AST: 21 U/L (ref 15–41)
Albumin: 3.7 g/dL (ref 3.5–5.0)
Alkaline Phosphatase: 48 U/L (ref 38–126)
Anion gap: 5 (ref 5–15)
BUN: 18 mg/dL (ref 8–23)
CO2: 27 mmol/L (ref 22–32)
Calcium: 9.4 mg/dL (ref 8.9–10.3)
Chloride: 102 mmol/L (ref 98–111)
Creatinine, Ser: 0.85 mg/dL (ref 0.61–1.24)
GFR, Estimated: >60 mL/min (ref >60)
Glucose, Bld: 143 mg/dL — ABNORMAL HIGH (ref 70–99)
Potassium: 4.2 mmol/L (ref 3.5–5.1)
Sodium: 134 mmol/L — ABNORMAL LOW (ref 135–145)
Total Bilirubin: 0.5 mg/dL (ref 0.3–1.2)
Total Protein: 6.9 g/dL (ref 6.5–8.1)

## 2022-06-04 LAB — GLUCOSE, CAPILLARY
Glucose-Capillary: 130 mg/dL — ABNORMAL HIGH (ref 70–99)
Glucose-Capillary: 99 mg/dL (ref 70–99)

## 2022-06-04 LAB — CBC
HCT: 37 % — ABNORMAL LOW (ref 39.0–52.0)
Hemoglobin: 11.3 g/dL — ABNORMAL LOW (ref 13.0–17.0)
MCH: 23.1 pg — ABNORMAL LOW (ref 26.0–34.0)
MCHC: 30.5 g/dL (ref 30.0–36.0)
MCV: 75.5 fL — ABNORMAL LOW (ref 80.0–100.0)
Platelets: 267 10*3/uL (ref 150–400)
RBC: 4.9 MIL/uL (ref 4.22–5.81)
RDW: 14.1 % (ref 11.5–15.5)
WBC: 4.4 10*3/uL (ref 4.0–10.5)
nRBC: 0 % (ref 0.0–0.2)

## 2022-06-04 LAB — HEMOGLOBIN A1C
Hgb A1c MFr Bld: 6.4 % — ABNORMAL HIGH (ref 4.8–5.6)
Mean Plasma Glucose: 137 mg/dL

## 2022-06-04 MED ORDER — MOXIFLOXACIN HCL 0.5 % OP SOLN: 1.0000 [drp] | Freq: Four times a day (QID) | OPHTHALMIC | 0 refills | Status: AC

## 2022-06-04 MED ORDER — HYDROCODONE-ACETAMINOPHEN 5-325 MG PO TABS: 1.0000 | ORAL_TABLET | ORAL | 0 refills | Status: DC | PRN

## 2022-06-04 MED ORDER — VALACYCLOVIR HCL 1 G PO TABS: 1000.0000 mg | ORAL_TABLET | Freq: Three times a day (TID) | ORAL | 0 refills | Status: AC

## 2022-06-04 NOTE — TOC Initial Note (Signed)
Transition of Care [facility]) - Initial/Assessment Note    Patient Details  Name: Harman Langhans MRN: [id number] Date of Birth: 10/05/1950  Transition of Care [facility]) CM/SW Contact:    Henrietta Dine, RN Phone Number: 06/04/2022, 9:02 AM  Clinical Narrative:                  Transition of Care [facility]) Screening Note   Patient Details  Name: Quantel Mcinturff Date of Birth: [birth year]   Transition of Care [facility]) CM/SW Contact:    Henrietta Dine, RN Phone Number: 06/04/2022, 9:03 AM    Transition of Care Department [facility]) has reviewed patient and no TOC needs have been identified at this time. We will continue to monitor patient advancement through interdisciplinary progression rounds. If new patient transition needs arise, please place a TOC consult.          Patient Goals and CMS Choice        Expected Discharge Plan and Services           Expected Discharge Date: 06/04/22                                    Prior Living Arrangements/Services                       Activities of Daily Living Home Assistive Devices/Equipment: None ADL Screening (condition at time of admission) Patient's cognitive ability adequate to safely complete daily activities?: Yes Is the patient deaf or have difficulty hearing?: No Does the patient have difficulty seeing, even when wearing glasses/contacts?: No Does the patient have difficulty concentrating, remembering, or making decisions?: No Patient able to express need for assistance with ADLs?: Yes Does the patient have difficulty dressing or bathing?: No Independently performs ADLs?: Yes (appropriate for developmental age) Does the patient have difficulty walking or climbing stairs?: No Weakness of Legs: None Weakness of Arms/Hands: None  Permission Sought/Granted                  Emotional Assessment              Admission diagnosis:  Herpes zoster ophthalmicus [B02.30] Zoster  encephalitis [B02.0] Herpes zoster ophthalmicus of left eye [B02.30] Patient Active Problem List   Diagnosis Date Noted   Shingles 06/03/2022   Herpes zoster ophthalmicus of left eye 06/03/2022   GERD (gastroesophageal reflux disease) 06/03/2022   Prostate cancer ([place]) 10/22/2021   Type 2 diabetes mellitus without complication, without long-term current use of insulin ([place]) 08/17/2021   Microcytic anemia 08/17/2021   Elevated PSA, greater than or equal to 20 ng/ml 08/16/2021   Sleep apnea 08/16/2021   RLS (restless legs syndrome) 08/16/2021   Healthcare maintenance 08/16/2021   NSAID induced gastritis 08/16/2021   Benign prostatic hyperplasia with urinary frequency 08/16/2021   PCP:  Libby Maw, MD Pharmacy:   [place], [place] - [place] [address] [place] [address] Phone: [phone] Fax: [phone]     Social Determinants of Health (SDOH) Interventions    Readmission Risk Interventions     No data to display

## 2022-06-04 NOTE — Discharge Summary (Signed)
Physician Discharge Summary  Steven Jensen WRU:[id number] DOB: 05-01-1951 DOA: 06/03/2022  PCP: Libby Maw, MD  Admit date: 06/03/2022 Discharge date: 06/04/2022  Admitted From: home Discharge disposition: home   Recommendations for Outpatient Follow-Up:   Outpatient ophthalmology follow up   Discharge Diagnosis:   Principal Problem:   Herpes zoster ophthalmicus of left eye Active Problems:   Type 2 diabetes mellitus without complication, without long-term current use of insulin (HCC)   Prostate cancer ([place])   Shingles   GERD (gastroesophageal reflux disease)    Discharge Condition: Improved.  Diet recommendation:  Carbohydrate-modified.    Wound care: None.  Code status: Full.   History of Present Illness:   Steven Jensen is a 71 y.o. male with medical history significant of prostate CA, anemia, GERD, DM2. Presenting with left facial rash. He noticed a rash begin on his left forehead and around his left eye 5 to 6 days ago. It has been itchy and painful. He has had headaches. He has tried OTC meds but they have not helped. His eye has been a little more red, but he has not noticed and visual changes. When his symptoms did not improve this morning, he decided to come to the ED for assistance. He denies any other aggravating or alleviating factors.    Hospital Course by Problem:   71 yo male bph, dm2, hx prostate cancer admitted 11/24 for concern zoster with left ophthalmic cn5 distribution and headache   He has no clinical sx of meningitis or encephalitis and there is no sign of [place] syndrome   He has hx left cataract surgery and he doesn't see out of that eye. He reports slight discomfort out of the eye and there is mild conjuntivitis there  Plan:  valtrex 1 gram tid and finish 10 day treatment Advise patient to get zoster adjuvant vaccine via PCP office when able ID consult F/u ophthalmology to monitor for eye  complication Discussed with ophthalmology: added abx gtt    Medical Consultants:   ID Ophthalmology (phone)   Discharge Exam:   Vitals:   06/04/22 0218 06/04/22 0624  BP: (!) 151/94 134/81  Pulse: 84 86  Resp: 18 14  Temp: 98 F (36.7 C) 98.8 F (37.1 C)  SpO2: 99% 99%   Vitals:   06/03/22 1653 06/03/22 2208 06/04/22 0218 06/04/22 0624  BP: (!) 150/94 (!) 148/86 (!) 151/94 134/81  Pulse: 92 93 84 86  Resp: '18 18 18 14  '$ Temp: 99.1 F (37.3 C) 98.3 F (36.8 C) 98 F (36.7 C) 98.8 F (37.1 C)  TempSrc:  Oral Oral   SpO2: 100% 100% 99% 99%  Weight:      Height:        General exam: Appears calm and comfortable.   The results of significant diagnostics from this hospitalization (including imaging, microbiology, ancillary and laboratory) are listed below for reference.     Procedures and Diagnostic Studies:   No results found.   Labs:   Basic Metabolic Panel: Recent Labs  Lab 06/03/22 0935 06/04/22 0318  NA 133* 134*  K 3.9 4.2  CL 102 102  CO2 25 27  GLUCOSE 106* 143*  BUN 19 18  CREATININE 0.87 0.85  CALCIUM 9.6 9.4   GFR Estimated Creatinine Clearance: 79.9 mL/min (by C-G formula based on SCr of 0.85 mg/dL). Liver Function Tests: Recent Labs  Lab 06/04/22 0318  AST 21  ALT 20  ALKPHOS 48  BILITOT 0.5  PROT 6.9  ALBUMIN 3.7   No results for input(s): "LIPASE", "AMYLASE" in the last 168 hours. No results for input(s): "AMMONIA" in the last 168 hours. Coagulation profile No results for input(s): "INR", "PROTIME" in the last 168 hours.  CBC: Recent Labs  Lab 06/03/22 0935 06/04/22 0318  WBC 4.3 4.4  NEUTROABS 2.3  --   HGB 12.2* 11.3*  HCT 40.2 37.0*  MCV 75.3* 75.5*  PLT 308 267   Cardiac Enzymes: No results for input(s): "CKTOTAL", "CKMB", "CKMBINDEX", "TROPONINI" in the last 168 hours. BNP: Invalid input(s): "POCBNP" CBG: Recent Labs  Lab 06/03/22 1650 06/03/22 2208 06/04/22 0855  GLUCAP 112* 108* 130*    D-Dimer No results for input(s): "DDIMER" in the last 72 hours. Hgb A1c Recent Labs    06/03/22 1317  HGBA1C 6.4*   Lipid Profile No results for input(s): "CHOL", "HDL", "LDLCALC", "TRIG", "CHOLHDL", "LDLDIRECT" in the last 72 hours. Thyroid function studies No results for input(s): "TSH", "T4TOTAL", "T3FREE", "THYROIDAB" in the last 72 hours.  Invalid input(s): "FREET3" Anemia work up No results for input(s): "VITAMINB12", "FOLATE", "FERRITIN", "TIBC", "IRON", "RETICCTPCT" in the last 72 hours. Microbiology No results found for this or any previous visit (from the past 240 hour(s)).   Discharge Instructions:   Discharge Instructions     Diet Carb Modified   Complete by: As directed    Increase activity slowly   Complete by: As directed       Allergies as of 06/04/2022   No Known Allergies      Medication List     TAKE these medications    acetaminophen 500 MG tablet Commonly known as: TYLENOL Take 1,000 mg by mouth every 6 (six) hours as needed for mild pain.   aspirin-acetaminophen-caffeine 250-250-65 MG tablet Commonly known as: EXCEDRIN MIGRAINE Take 1 tablet by mouth every 6 (six) hours as needed for headache.   CALCIUM 600 PO Take 600 mg by mouth daily.   ferrous sulfate 324 MG Tbec Take 324 mg by mouth daily.   HYDROcodone-acetaminophen 5-325 MG tablet Commonly known as: NORCO/VICODIN Take 1-2 tablets by mouth every 4 (four) hours as needed for moderate pain.   metFORMIN 500 MG 24 hr tablet Commonly known as: Glucophage XR Take one nightly before bed. What changed:  how to take this when to take this   moxifloxacin 0.5 % ophthalmic solution Commonly known as: Vigamox Place 1 drop into the right eye in the morning, at noon, in the evening, and at bedtime for 7 days.   multivitamin capsule Take 1 capsule by mouth daily. Multi vitamin with iron   Nubeqa 300 MG tablet Generic drug: darolutamide Take 600 mg by mouth 2 (two) times daily  with a meal.   pantoprazole 40 MG tablet Commonly known as: PROTONIX Take 1 tablet by mouth once daily What changed: when to take this   prochlorperazine 10 MG tablet Commonly known as: COMPAZINE Take 1 tablet (10 mg total) by mouth every 6 (six) hours as needed for nausea or vomiting.   tamsulosin 0.4 MG Caps capsule Commonly known as: FLOMAX Take 1 capsule (0.4 mg total) by mouth daily. What changed:  how much to take when to take this   valACYclovir 1000 MG tablet Commonly known as: Valtrex Take 1 tablet (1,000 mg total) by mouth 3 (three) times daily for 9 days.        Follow-up Information     Libby Maw, MD Follow up.   Specialty: Family Medicine Contact  information: [place] [address] [phone]         Corey Harold, MD Follow up.   Contact information: [place] [address] [phone]                  Time coordinating discharge: 45 min  Signed:  Geradine Girt DO  [facility] 06/04/2022, 11:15 AM

## 2022-06-04 NOTE — Progress Notes (Signed)
Discharge package printed and instructions given to patient. Verbalizes understanding.  

## 2022-06-04 NOTE — Plan of Care (Signed)
  Problem: Pain Managment: Goal: General experience of comfort will improve Outcome: Progressing   

## 2022-06-06 ENCOUNTER — Telehealth: Payer: Self-pay

## 2022-06-06 NOTE — Telephone Encounter (Signed)
Transition Care Management Follow-up Telephone Call Date of discharge and from where: 06/04/22 [facility]. Dx: Herpes zoster ophthalmicus L eye How have you been since you were released from the hospital? I'm feeling better Any questions or concerns? No  Items Reviewed: Did the pt receive and understand the discharge instructions provided? Yes  Medications obtained and verified? Yes  Other? No  Any new allergies since your discharge? No  Dietary orders reviewed? No Do you have support at home? Yes   Home Care and Equipment/Supplies: Were home health services ordered? not applicable If so, what is the name of the agency? N/a  Has the agency set up a time to come to the patient's home? not applicable Were any new equipment or medical supplies ordered?  No What is the name of the medical supply agency? N/a Were you able to get the supplies/equipment? not applicable Do you have any questions related to the use of the equipment or supplies? No  Functional Questionnaire: (I = Independent and D = Dependent) ADLs: I  Bathing/Dressing- I  Meal Prep- I  Eating- I  Maintaining continence- I  Transferring/Ambulation- I  Managing Meds- I  Follow up appointments reviewed:  PCP Hospital f/u appt confirmed? Yes  Scheduled to see Dr. Ethelene Hal on 06/09/22 @ 11:00am. [facility] f/u appt confirmed? No  Scheduled to see n/a on n/a @ n/a. Are transportation arrangements needed? No  If their condition worsens, is the pt aware to call PCP or go to the Emergency Dept.? Yes Was the patient provided with contact information for the PCP's office or ED? Yes Was to pt encouraged to call back with questions or concerns? Yes

## 2022-06-08 ENCOUNTER — Encounter: Payer: Self-pay | Admitting: *Deleted

## 2022-06-08 NOTE — Progress Notes (Signed)
[facility] Quality Team Note  Name: Steven Jensen Date of Birth: 1950/07/24 MRN: [id number] Date: 06/08/2022  [facility] Quality Team has reviewed this patient's chart, please see recommendations below:  Diabetic Retinal Eye Exam;   Called pt to see if interested in eye event on 06/28/2022 at practice.  Pt requested to discuss it at [facility] tomorrow.

## 2022-06-09 ENCOUNTER — Encounter: Payer: Self-pay | Admitting: Family Medicine

## 2022-06-09 ENCOUNTER — Ambulatory Visit (INDEPENDENT_AMBULATORY_CARE_PROVIDER_SITE_OTHER): Payer: Medicare HMO | Admitting: Family Medicine

## 2022-06-09 VITALS — BP 138/82 | HR 77 | Temp 97.3°F | Ht 69.0 in | Wt 164.4 lb

## 2022-06-09 DIAGNOSIS — B0230 Zoster ocular disease, unspecified: Secondary | ICD-10-CM

## 2022-06-09 DIAGNOSIS — E119 Type 2 diabetes mellitus without complications: Secondary | ICD-10-CM

## 2022-06-09 MED ORDER — GABAPENTIN 100 MG PO CAPS: 100.0000 mg | ORAL_CAPSULE | Freq: Every day | ORAL | 2 refills | Status: DC

## 2022-06-09 MED ORDER — GABAPENTIN 100 MG PO CAPS: 100.0000 mg | ORAL_CAPSULE | Freq: Three times a day (TID) | ORAL | 3 refills | Status: DC

## 2022-06-09 MED ORDER — MOXIFLOXACIN HCL 0.5 % OP SOLN: 1.0000 [drp] | Freq: Three times a day (TID) | OPHTHALMIC | 0 refills | Status: DC

## 2022-06-09 NOTE — Progress Notes (Signed)
Established Patient Office Visit  Subjective   Patient ID: Cordon Gassett, male    DOB: 1951-02-06  Age: 71 y.o. MRN: [id number]  Chief Complaint  Patient presents with   Hospitalization [facility] follow on Shingles in eye, no concerns. Patient states that he still has a little headache sometimes.     HPI for follow-up of recent hospitalization on the 24th for herpes zoster ophthalmicus left eye.  He is improving.  Eyesight is preserved.  Almost completed his course Valtrex.  Continues with Vigamox for prophylaxis.  Had run out of drops prior to the suggested 7-day course.  Continues with headache that started after his outbreak mostly experienced each morning.  Tylenol has not been sufficient.  Hemoglobin A1c 6.4 metformin.  Continues to tolerate that medication well.  Treatment for prostate cancer is going well.  He is back to work  Follow-up    Review of Systems  Constitutional: Negative.   HENT: Negative.    Eyes:  Negative for blurred vision, discharge and redness.  Respiratory: Negative.    Cardiovascular: Negative.   Gastrointestinal:  Negative for abdominal pain.  Genitourinary: Negative.   Musculoskeletal: Negative.  Negative for myalgias.  Skin:  Positive for rash.  Neurological:  Positive for headaches. Negative for tingling, loss of consciousness and weakness.  Endo/Heme/Allergies:  Negative for polydipsia.      Objective:     BP 138/82 (BP Location: Left Arm, Patient Position: Sitting, Cuff Size: Normal)   Pulse 77   Temp (!) 97.3 F (36.3 C) (Temporal)   Ht '5\' 9"'$  (1.753 m)   Wt 164 lb 6.4 oz (74.6 kg)   SpO2 97%   BMI 24.28 kg/m    Physical Exam Constitutional:      General: He is not in acute distress.    Appearance: Normal appearance. He is not ill-appearing, toxic-appearing or diaphoretic.  HENT:     Head: Normocephalic and atraumatic.     Right Ear: External ear normal.     Left Ear: External ear normal.     Mouth/Throat:      Mouth: Mucous membranes are moist.     Pharynx: Oropharynx is clear. No oropharyngeal exudate or posterior oropharyngeal erythema.  Eyes:     General: No scleral icterus.       Right eye: No discharge.        Left eye: No discharge.     Extraocular Movements: Extraocular movements intact.     Conjunctiva/sclera: Conjunctivae normal.     Pupils: Pupils are equal, round, and reactive to light.  Cardiovascular:     Rate and Rhythm: Normal rate and regular rhythm.  Pulmonary:     Effort: Pulmonary effort is normal. No respiratory distress.     Breath sounds: Normal breath sounds.  Abdominal:     General: Bowel sounds are normal.     Tenderness: There is no abdominal tenderness. There is no guarding.  Musculoskeletal:     Cervical back: No rigidity or tenderness.  Skin:    General: Skin is warm and dry.  Neurological:     Mental Status: He is alert and oriented to person, place, and time.  Psychiatric:        Mood and Affect: Mood normal.        Behavior: Behavior normal.      No results found for any visits on 06/09/22.    The 10-year ASCVD risk score (Arnett DK, et al., 2019) is: 26.5%  Assessment & Plan:   Problem List Items Addressed This Visit       Endocrine   Type 2 diabetes mellitus without complication, without long-term current use of insulin (HCC) - Primary     Nervous and Auditory   Herpes zoster ophthalmicus of left eye   Relevant Medications   moxifloxacin (VIGAMOX) 0.5 % ophthalmic solution   gabapentin (NEURONTIN) 100 MG capsule    Return in about 3 months (around 09/08/2022).  Continue Vigamox for few more days.  Scheduled to see ophthalmology for follow-up.  Continue metformin 500 mg XL and at bedtime.  We will start 100 mg at at bedtime for headache associated with recent zoster infection.  We will need Shingrix vaccine in 6 months.  Libby Maw, MD

## 2022-06-10 ENCOUNTER — Other Ambulatory Visit: Payer: Self-pay

## 2022-07-06 ENCOUNTER — Other Ambulatory Visit: Payer: Self-pay | Admitting: Family Medicine

## 2022-07-06 DIAGNOSIS — K2960 Other gastritis without bleeding: Secondary | ICD-10-CM

## 2022-07-06 DIAGNOSIS — E119 Type 2 diabetes mellitus without complications: Secondary | ICD-10-CM

## 2022-07-13 DIAGNOSIS — C778 Secondary and unspecified malignant neoplasm of lymph nodes of multiple regions: Secondary | ICD-10-CM | POA: Diagnosis not present

## 2022-07-13 DIAGNOSIS — C7951 Secondary malignant neoplasm of bone: Secondary | ICD-10-CM | POA: Diagnosis not present

## 2022-07-13 DIAGNOSIS — C61 Malignant neoplasm of prostate: Secondary | ICD-10-CM | POA: Diagnosis not present

## 2022-07-13 DIAGNOSIS — N130 Hydronephrosis with ureteropelvic junction obstruction: Secondary | ICD-10-CM | POA: Diagnosis not present

## 2022-07-20 ENCOUNTER — Other Ambulatory Visit: Payer: Medicare HMO

## 2022-07-22 ENCOUNTER — Ambulatory Visit: Payer: Medicare HMO | Admitting: Family Medicine

## 2022-07-22 ENCOUNTER — Other Ambulatory Visit: Payer: Medicare HMO

## 2022-08-05 ENCOUNTER — Other Ambulatory Visit: Payer: Medicare HMO

## 2022-08-19 ENCOUNTER — Other Ambulatory Visit: Payer: Medicare HMO

## 2022-08-24 ENCOUNTER — Other Ambulatory Visit: Payer: Self-pay

## 2022-09-09 ENCOUNTER — Ambulatory Visit: Payer: Medicare HMO | Admitting: Family Medicine

## 2022-09-09 ENCOUNTER — Other Ambulatory Visit: Payer: Self-pay

## 2022-09-09 ENCOUNTER — Other Ambulatory Visit: Payer: Medicare HMO

## 2022-09-16 ENCOUNTER — Other Ambulatory Visit: Payer: Medicare HMO

## 2022-09-23 ENCOUNTER — Ambulatory Visit: Payer: Medicare HMO | Admitting: Family Medicine

## 2022-09-30 DIAGNOSIS — C61 Malignant neoplasm of prostate: Secondary | ICD-10-CM | POA: Diagnosis not present

## 2022-10-08 ENCOUNTER — Other Ambulatory Visit: Payer: Self-pay | Admitting: Family Medicine

## 2022-10-08 DIAGNOSIS — K2960 Other gastritis without bleeding: Secondary | ICD-10-CM

## 2022-10-08 DIAGNOSIS — E119 Type 2 diabetes mellitus without complications: Secondary | ICD-10-CM

## 2022-10-11 ENCOUNTER — Other Ambulatory Visit: Payer: Self-pay

## 2022-10-14 DIAGNOSIS — I700 Atherosclerosis of aorta: Secondary | ICD-10-CM | POA: Diagnosis not present

## 2022-10-14 DIAGNOSIS — C61 Malignant neoplasm of prostate: Secondary | ICD-10-CM | POA: Diagnosis not present

## 2022-10-14 DIAGNOSIS — N134 Hydroureter: Secondary | ICD-10-CM | POA: Diagnosis not present

## 2022-10-15 ENCOUNTER — Other Ambulatory Visit: Payer: Self-pay

## 2022-10-19 DIAGNOSIS — C61 Malignant neoplasm of prostate: Secondary | ICD-10-CM | POA: Diagnosis not present

## 2022-10-19 DIAGNOSIS — R230 Cyanosis: Secondary | ICD-10-CM | POA: Diagnosis not present

## 2022-10-19 DIAGNOSIS — C7951 Secondary malignant neoplasm of bone: Secondary | ICD-10-CM | POA: Diagnosis not present

## 2022-10-19 DIAGNOSIS — C778 Secondary and unspecified malignant neoplasm of lymph nodes of multiple regions: Secondary | ICD-10-CM | POA: Diagnosis not present

## 2022-11-04 ENCOUNTER — Other Ambulatory Visit: Payer: Self-pay

## 2022-11-04 ENCOUNTER — Other Ambulatory Visit: Payer: Medicare HMO

## 2022-11-04 ENCOUNTER — Ambulatory Visit: Payer: Medicare HMO | Admitting: Family Medicine

## 2022-11-14 ENCOUNTER — Other Ambulatory Visit: Payer: Medicare HMO

## 2022-11-18 ENCOUNTER — Ambulatory Visit: Payer: Medicare HMO | Admitting: Family Medicine

## 2022-11-18 ENCOUNTER — Other Ambulatory Visit: Payer: Self-pay

## 2022-12-01 ENCOUNTER — Telehealth: Payer: Self-pay | Admitting: Family Medicine

## 2022-12-01 NOTE — Telephone Encounter (Signed)
Pt has cancelled appt for tomorrow. Pt has cancelled over 10x in the past 1 year. Pt has rescheduled for 12/16/2022. Please make note to discuss with patient so he is meeting needs of care plan.

## 2022-12-02 ENCOUNTER — Other Ambulatory Visit: Payer: Medicare HMO

## 2022-12-02 ENCOUNTER — Other Ambulatory Visit: Payer: Self-pay

## 2022-12-02 ENCOUNTER — Ambulatory Visit: Payer: Medicare HMO | Admitting: Family Medicine

## 2022-12-15 ENCOUNTER — Other Ambulatory Visit: Payer: Self-pay

## 2022-12-16 ENCOUNTER — Ambulatory Visit: Payer: Medicare HMO | Admitting: Family Medicine

## 2022-12-21 ENCOUNTER — Encounter: Payer: Self-pay | Admitting: *Deleted

## 2022-12-21 NOTE — Progress Notes (Signed)
[facility] Quality Team Note  Name: Steven Jensen Date of Birth: [birth year] MRN: [id number] Date: 12/21/2022  [facility] Quality Team has reviewed this patient's chart, please see recommendations below:  [facility] Quality Other; Pt has open gaps for AWV (not due until Oct), colon screening, diabetic retinopathy screening (have left voice messages for pt about eye events), and A1C.  Pt has ov 12/23/2022.  Would provider be able to address at ov?

## 2022-12-23 ENCOUNTER — Ambulatory Visit: Payer: Medicare HMO | Admitting: Family Medicine

## 2022-12-23 ENCOUNTER — Other Ambulatory Visit: Payer: Self-pay

## 2022-12-30 ENCOUNTER — Encounter: Payer: Self-pay | Admitting: Family Medicine

## 2022-12-30 ENCOUNTER — Ambulatory Visit (INDEPENDENT_AMBULATORY_CARE_PROVIDER_SITE_OTHER): Payer: Medicare HMO | Admitting: Family Medicine

## 2022-12-30 VITALS — BP 134/72 | HR 83 | Temp 97.1°F | Ht 69.0 in | Wt 171.8 lb

## 2022-12-30 DIAGNOSIS — N401 Enlarged prostate with lower urinary tract symptoms: Secondary | ICD-10-CM

## 2022-12-30 DIAGNOSIS — R350 Frequency of micturition: Secondary | ICD-10-CM | POA: Diagnosis not present

## 2022-12-30 DIAGNOSIS — H6123 Impacted cerumen, bilateral: Secondary | ICD-10-CM

## 2022-12-30 DIAGNOSIS — E119 Type 2 diabetes mellitus without complications: Secondary | ICD-10-CM

## 2022-12-30 DIAGNOSIS — Z7984 Long term (current) use of oral hypoglycemic drugs: Secondary | ICD-10-CM | POA: Diagnosis not present

## 2022-12-30 DIAGNOSIS — T39395A Adverse effect of other nonsteroidal anti-inflammatory drugs [NSAID], initial encounter: Secondary | ICD-10-CM | POA: Diagnosis not present

## 2022-12-30 DIAGNOSIS — Z1159 Encounter for screening for other viral diseases: Secondary | ICD-10-CM

## 2022-12-30 DIAGNOSIS — K2960 Other gastritis without bleeding: Secondary | ICD-10-CM | POA: Diagnosis not present

## 2022-12-30 LAB — CBC
HCT: 35.9 % — ABNORMAL LOW (ref 39.0–52.0)
Hemoglobin: 11.1 g/dL — ABNORMAL LOW (ref 13.0–17.0)
MCHC: 30.8 g/dL (ref 30.0–36.0)
MCV: 74.3 fl — ABNORMAL LOW (ref 78.0–100.0)
Platelets: 313 10*3/uL (ref 150.0–400.0)
RBC: 4.83 Mil/uL (ref 4.22–5.81)
RDW: 14.5 % (ref 11.5–15.5)
WBC: 4.2 10*3/uL (ref 4.0–10.5)

## 2022-12-30 LAB — BASIC METABOLIC PANEL
BUN: 21 mg/dL (ref 6–23)
CO2: 29 mEq/L (ref 19–32)
Calcium: 9.6 mg/dL (ref 8.4–10.5)
Chloride: 103 mEq/L (ref 96–112)
Creatinine, Ser: 0.85 mg/dL (ref 0.40–1.50)
GFR: 86.32 mL/min (ref >60.00)
Glucose, Bld: 98 mg/dL (ref 70–99)
Potassium: 4.4 mEq/L (ref 3.5–5.1)
Sodium: 137 mEq/L (ref 135–145)

## 2022-12-30 LAB — HEMOGLOBIN A1C: Hgb A1c MFr Bld: 6.1 % (ref 4.6–6.5)

## 2022-12-30 MED ORDER — DEBROX 6.5 % OT SOLN: 5.0000 [drp] | Freq: Two times a day (BID) | OTIC | 2 refills | Status: DC

## 2022-12-30 MED ORDER — METFORMIN HCL ER 500 MG PO TB24: ORAL_TABLET | ORAL | 0 refills | Status: DC

## 2022-12-30 MED ORDER — PANTOPRAZOLE SODIUM 40 MG PO TBEC: 40.0000 mg | DELAYED_RELEASE_TABLET | Freq: Every day | ORAL | 1 refills | Status: DC

## 2022-12-30 MED ORDER — TAMSULOSIN HCL 0.4 MG PO CAPS: 0.4000 mg | ORAL_CAPSULE | Freq: Every day | ORAL | 3 refills | Status: AC

## 2022-12-30 NOTE — Progress Notes (Signed)
Established Patient Office Visit   Subjective:  Patient ID: Steven Jensen, male    DOB: 1951-02-05  Age: 72 y.o. MRN: [id number]  Chief Complaint  Patient presents with   Medical Management of Chronic Issues    3 month follow up, no concerns. Patient not fasting.     HPI Encounter Diagnoses  Name Primary?   Type 2 diabetes mellitus without complication, without long-term current use of insulin (HCC) Yes   Benign prostatic hyperplasia with urinary frequency    NSAID induced gastritis    Encounter for hepatitis C screening test for low risk patient    Bilateral impacted cerumen    For follow-up of above.  Nonfasting.  Responding well to therapy for prostate cancer.  Feels good.  No issues with current medications.   ROS   Current Outpatient Medications:    acetaminophen (TYLENOL) 500 MG tablet, Take 1,000 mg by mouth every 6 (six) hours as needed for mild pain., Disp: , Rfl:    aspirin-acetaminophen-caffeine (EXCEDRIN MIGRAINE) 250-250-65 MG tablet, Take 1 tablet by mouth every 6 (six) hours as needed for headache., Disp: , Rfl:    Calcium Carbonate (CALCIUM 600 PO), Take 600 mg by mouth daily., Disp: , Rfl:    carbamide peroxide (DEBROX) 6.5 % OTIC solution, Place 5 drops into both ears 2 (two) times daily., Disp: 15 mL, Rfl: 2   darolutamide (NUBEQA) 300 MG tablet, Take 600 mg by mouth 2 (two) times daily with a meal., Disp: , Rfl:    ferrous sulfate 324 MG TBEC, Take 324 mg by mouth daily., Disp: , Rfl:    Multiple Vitamin (MULTIVITAMIN) capsule, Take 1 capsule by mouth daily. Multi vitamin with iron, Disp: , Rfl:    gabapentin (NEURONTIN) 100 MG capsule, Take 1 capsule (100 mg total) by mouth at bedtime. (Patient not taking: Reported on 12/30/2022), Disp: 30 capsule, Rfl: 2   metFORMIN (GLUCOPHAGE-XR) 500 MG 24 hr tablet, TAKE 1 TABLET BY MOUTH NIGHTLY AT BEDTIME, Disp: 90 tablet, Rfl: 0   pantoprazole (PROTONIX) 40 MG tablet, Take 1 tablet (40 mg total) by mouth daily.,  Disp: 90 tablet, Rfl: 1   tamsulosin (FLOMAX) 0.4 MG CAPS capsule, Take 1 capsule (0.4 mg total) by mouth daily., Disp: 30 capsule, Rfl: 3   Objective:     BP 134/72 (BP Location: Right Arm, Patient Position: Sitting, Cuff Size: Normal)   Pulse 83   Temp (!) 97.1 F (36.2 C) (Temporal)   Ht 5\' 9"  (1.753 m)   Wt 171 lb 12.8 oz (77.9 kg)   SpO2 98%   BMI 25.37 kg/m    Physical Exam   No results found for any visits on 12/30/22.    The 10-year ASCVD risk score (Arnett DK, et al., 2019) is: 26.1%    Assessment & Plan:   Type 2 diabetes mellitus without complication, without long-term current use of insulin (HCC) -     metFORMIN HCl ER; TAKE 1 TABLET BY MOUTH NIGHTLY AT BEDTIME  Dispense: 90 tablet; Refill: 0 -     Basic metabolic panel -     CBC -     Hemoglobin A1c  Benign prostatic hyperplasia with urinary frequency -     Tamsulosin HCl; Take 1 capsule (0.4 mg total) by mouth daily.  Dispense: 30 capsule; Refill: 3  NSAID induced gastritis -     Pantoprazole Sodium; Take 1 tablet (40 mg total) by mouth daily.  Dispense: 90 tablet; Refill: 1  Encounter for hepatitis  C screening test for low risk patient -     Hepatitis C antibody  Bilateral impacted cerumen -     Debrox; Place 5 drops into both ears 2 (two) times daily.  Dispense: 15 mL; Refill: 2    Return in about 6 months (around 07/01/2023), or Return fasting at next visit please.  Recheck fasting lipids at next visit.  Will need to consider using a statin.  Continue all medications as indicated above.  Metformin adjusted pending results of A1c today.  Will use Debrox drops and return for irrigation if needed. Mliss Sax, MD

## 2022-12-31 ENCOUNTER — Other Ambulatory Visit: Payer: Self-pay

## 2022-12-31 LAB — HEPATITIS C ANTIBODY: Hepatitis C Ab: NONREACTIVE

## 2023-01-03 ENCOUNTER — Other Ambulatory Visit: Payer: Self-pay | Admitting: Family Medicine

## 2023-01-03 DIAGNOSIS — E119 Type 2 diabetes mellitus without complications: Secondary | ICD-10-CM

## 2023-01-18 DIAGNOSIS — C61 Malignant neoplasm of prostate: Secondary | ICD-10-CM | POA: Diagnosis not present

## 2023-01-25 DIAGNOSIS — C778 Secondary and unspecified malignant neoplasm of lymph nodes of multiple regions: Secondary | ICD-10-CM | POA: Diagnosis not present

## 2023-01-25 DIAGNOSIS — N401 Enlarged prostate with lower urinary tract symptoms: Secondary | ICD-10-CM | POA: Diagnosis not present

## 2023-01-25 DIAGNOSIS — C61 Malignant neoplasm of prostate: Secondary | ICD-10-CM | POA: Diagnosis not present

## 2023-01-25 DIAGNOSIS — C7951 Secondary malignant neoplasm of bone: Secondary | ICD-10-CM | POA: Diagnosis not present

## 2023-01-25 DIAGNOSIS — R3914 Feeling of incomplete bladder emptying: Secondary | ICD-10-CM | POA: Diagnosis not present

## 2023-02-13 ENCOUNTER — Ambulatory Visit (INDEPENDENT_AMBULATORY_CARE_PROVIDER_SITE_OTHER): Payer: Medicare HMO

## 2023-02-13 DIAGNOSIS — Z0000 Encounter for general adult medical examination without abnormal findings: Secondary | ICD-10-CM | POA: Diagnosis not present

## 2023-02-13 NOTE — Patient Instructions (Signed)
Steven Jensen , Thank you for taking time to come for your Medicare Wellness Visit. I appreciate your ongoing commitment to your health goals. Please review the following plan we discussed and let me know if I can assist you in the future.   Referrals/Orders/Follow-Ups/Clinician Recommendations: none  This is a list of the screening recommended for you and due dates:  Health Maintenance  Topic Date Due   Complete foot exam   Never done   Yearly kidney health urinalysis for diabetes  02/12/2023   Eye exam for diabetics  07/01/2023*   Colon Cancer Screening  12/20/2023*   Hemoglobin A1C  07/01/2023   Yearly kidney function blood test for diabetes  12/30/2023   Medicare Annual Wellness Visit  02/13/2024   Hepatitis C Screening  Completed   HPV Vaccine  Aged Out   DTaP/Tdap/Td vaccine  Discontinued   Pneumonia Vaccine  Discontinued   Flu Shot  Discontinued   COVID-19 Vaccine  Discontinued   Zoster (Shingles) Vaccine  Discontinued  *Topic was postponed. The date shown is not the original due date.    Advanced directives: (ACP Link)Information on Advanced Care Planning can be found at [facility] Advance Health Care Directives Advance Health Care Directives ([email or web address]   Next Medicare Annual Wellness Visit scheduled for next year: No, wants to wait until closer to due date  Preventive Care 41 Years and Older, Male  Preventive care refers to lifestyle choices and visits with your health care provider that can promote health and wellness. What does preventive care include? A yearly physical exam. This is also called an annual well check. Dental exams once or twice a year. Routine eye exams. Ask your health care provider how often you should have your eyes checked. Personal lifestyle choices, including: Daily care of your teeth and gums. Regular physical activity. Eating a healthy diet. Avoiding tobacco and drug use. Limiting alcohol use. Practicing safe  sex. Taking low doses of aspirin every day. Taking vitamin and mineral supplements as recommended by your health care provider. What happens during an annual well check? The services and screenings done by your health care provider during your annual well check will depend on your age, overall health, lifestyle risk factors, and family history of disease. Counseling  Your health care provider may ask you questions about your: Alcohol use. Tobacco use. Drug use. Emotional well-being. Home and relationship well-being. Sexual activity. Eating habits. History of falls. Memory and ability to understand (cognition). Work and work Astronomer. Screening  You may have the following tests or measurements: Height, weight, and BMI. Blood pressure. Lipid and cholesterol levels. These may be checked every 5 years, or more frequently if you are over 56 years old. Skin check. Lung cancer screening. You may have this screening every year starting at age 64 if you have a 30-pack-year history of smoking and currently smoke or have quit within the past 15 years. Fecal occult blood test (FOBT) of the stool. You may have this test every year starting at age 39. Flexible sigmoidoscopy or colonoscopy. You may have a sigmoidoscopy every 5 years or a colonoscopy every 10 years starting at age 37. Prostate cancer screening. Recommendations will vary depending on your family history and other risks. Hepatitis C blood test. Hepatitis B blood test. Sexually transmitted disease (STD) testing. Diabetes screening. This is done by checking your blood sugar (glucose) after you have not eaten for a while (fasting). You may have this done every 1-3 years. Abdominal aortic  aneurysm (AAA) screening. You may need this if you are a current or former smoker. Osteoporosis. You may be screened starting at age 30 if you are at high risk. Talk with your health care provider about your test results, treatment options, and if  necessary, the need for more tests. Vaccines  Your health care provider may recommend certain vaccines, such as: Influenza vaccine. This is recommended every year. Tetanus, diphtheria, and acellular pertussis (Tdap, Td) vaccine. You may need a Td booster every 10 years. Zoster vaccine. You may need this after age 48. Pneumococcal 13-valent conjugate (PCV13) vaccine. One dose is recommended after age 77. Pneumococcal polysaccharide (PPSV23) vaccine. One dose is recommended after age 59. Talk to your health care provider about which screenings and vaccines you need and how often you need them. This information is not intended to replace advice given to you by your health care provider. Make sure you discuss any questions you have with your health care provider. Document Released: 07/24/2015 Document Revised: 03/16/2016 Document Reviewed: 04/28/2015 Elsevier Interactive Patient Education  2017 [employer].  Fall Prevention in the Home Falls can cause injuries. They can happen to people of all ages. There are many things you can do to make your home safe and to help prevent falls. What can I do on the outside of my home? Regularly fix the edges of walkways and driveways and fix any cracks. Remove anything that might make you trip as you walk through a door, such as a raised step or threshold. Trim any bushes or trees on the path to your home. Use bright outdoor lighting. Clear any walking paths of anything that might make someone trip, such as rocks or tools. Regularly check to see if handrails are loose or broken. Make sure that both sides of any steps have handrails. Any raised decks and porches should have guardrails on the edges. Have any leaves, snow, or ice cleared regularly. Use sand or salt on walking paths during winter. Clean up any spills in your garage right away. This includes oil or grease spills. What can I do in the bathroom? Use night lights. Install grab bars by the toilet  and in the tub and shower. Do not use towel bars as grab bars. Use non-skid mats or decals in the tub or shower. If you need to sit down in the shower, use a plastic, non-slip stool. Keep the floor dry. Clean up any water that spills on the floor as soon as it happens. Remove soap buildup in the tub or shower regularly. Attach bath mats securely with double-sided non-slip rug tape. Do not have throw rugs and other things on the floor that can make you trip. What can I do in the bedroom? Use night lights. Make sure that you have a light by your bed that is easy to reach. Do not use any sheets or blankets that are too big for your bed. They should not hang down onto the floor. Have a firm chair that has side arms. You can use this for support while you get dressed. Do not have throw rugs and other things on the floor that can make you trip. What can I do in the kitchen? Clean up any spills right away. Avoid walking on wet floors. Keep items that you use a lot in easy-to-reach places. If you need to reach something above you, use a strong step stool that has a grab bar. Keep electrical cords out of the way. Do not use floor  polish or wax that makes floors slippery. If you must use wax, use non-skid floor wax. Do not have throw rugs and other things on the floor that can make you trip. What can I do with my stairs? Do not leave any items on the stairs. Make sure that there are handrails on both sides of the stairs and use them. Fix handrails that are broken or loose. Make sure that handrails are as long as the stairways. Check any carpeting to make sure that it is firmly attached to the stairs. Fix any carpet that is loose or worn. Avoid having throw rugs at the top or bottom of the stairs. If you do have throw rugs, attach them to the floor with carpet tape. Make sure that you have a light switch at the top of the stairs and the bottom of the stairs. If you do not have them, ask someone to add  them for you. What else can I do to help prevent falls? Wear shoes that: Do not have high heels. Have rubber bottoms. Are comfortable and fit you well. Are closed at the toe. Do not wear sandals. If you use a stepladder: Make sure that it is fully opened. Do not climb a closed stepladder. Make sure that both sides of the stepladder are locked into place. Ask someone to hold it for you, if possible. Clearly mark and make sure that you can see: Any grab bars or handrails. First and last steps. Where the edge of each step is. Use tools that help you move around (mobility aids) if they are needed. These include: Canes. Walkers. Scooters. Crutches. Turn on the lights when you go into a dark area. Replace any light bulbs as soon as they burn out. Set up your furniture so you have a clear path. Avoid moving your furniture around. If any of your floors are uneven, fix them. If there are any pets around you, be aware of where they are. Review your medicines with your doctor. Some medicines can make you feel dizzy. This can increase your chance of falling. Ask your doctor what other things that you can do to help prevent falls. This information is not intended to replace advice given to you by your health care provider. Make sure you discuss any questions you have with your health care provider. Document Released: 04/23/2009 Document Revised: 12/03/2015 Document Reviewed: 08/01/2014 Elsevier Interactive Patient Education  2017 [employer].

## 2023-02-13 NOTE — Progress Notes (Signed)
Subjective:   Steven Jensen is a 72 y.o. male who presents for Medicare Annual/Subsequent preventive examination.  Visit Complete: Virtual  I connected with  Steven Jensen on 02/13/23 by a audio enabled telemedicine application and verified that I am speaking with the correct person using two identifiers.  Patient Location: Home  Provider Location: Office/Clinic  I discussed the limitations of evaluation and management by telemedicine. The patient expressed understanding and agreed to proceed.  Vital Signs: Patient was unable to self-report vital signs via telehealth due to a lack of equipment at home.  Review of Systems     Cardiac Risk Factors include: advanced age (>58men, >13 women);diabetes mellitus;male gender     Objective:    Today's Vitals   There is no height or weight on file to calculate BMI.     02/13/2023   11:03 AM 06/03/2022    1:00 PM 06/03/2022    7:45 AM 05/04/2022   12:08 PM 12/24/2021   10:38 AM 11/26/2021   12:27 PM  Advanced Directives  Does Patient Have a Medical Advance Directive? No No No No No No  Would patient like information on creating a medical advance directive?  Yes (MAU/Ambulatory/Procedural Areas - Information given)  No - Patient declined      Current Medications (verified) Outpatient Encounter Medications as of 02/13/2023  Medication Sig   acetaminophen (TYLENOL) 500 MG tablet Take 1,000 mg by mouth every 6 (six) hours as needed for mild pain.   aspirin-acetaminophen-caffeine (EXCEDRIN MIGRAINE) 250-250-65 MG tablet Take 1 tablet by mouth every 6 (six) hours as needed for headache.   Calcium Carbonate (CALCIUM 600 PO) Take 600 mg by mouth daily.   darolutamide (NUBEQA) 300 MG tablet Take 600 mg by mouth 2 (two) times daily with a meal.   ferrous sulfate 324 MG TBEC Take 324 mg by mouth daily.   metFORMIN (GLUCOPHAGE-XR) 500 MG 24 hr tablet TAKE 1 TABLET BY MOUTH NIGHTLY AT BEDTIME   Multiple Vitamin (MULTIVITAMIN) capsule Take 1  capsule by mouth daily. Multi vitamin with iron   pantoprazole (PROTONIX) 40 MG tablet Take 1 tablet (40 mg total) by mouth daily.   tamsulosin (FLOMAX) 0.4 MG CAPS capsule Take 1 capsule (0.4 mg total) by mouth daily.   carbamide peroxide (DEBROX) 6.5 % OTIC solution Place 5 drops into both ears 2 (two) times daily. (Patient not taking: Reported on 02/13/2023)   gabapentin (NEURONTIN) 100 MG capsule Take 1 capsule (100 mg total) by mouth at bedtime. (Patient not taking: Reported on 12/30/2022)   No facility-administered encounter medications on file as of 02/13/2023.    Allergies (verified) Patient has no known allergies.   History: Past Medical History:  Diagnosis Date   Cancer [facility])    Past Surgical History:  Procedure Laterality Date   EYE SURGERY     Family History  Problem Relation Age of Onset   Cancer Mother    Hypertension Mother    Cancer Sister    Cancer Brother    Hypertension Brother    Social History   Socioeconomic History   Marital status: Married    Spouse name: Not on file   Number of children: Not on file   Years of education: Not on file   Highest education level: Not on file  Occupational History   Not on file  Tobacco Use   Smoking status: Never   Smokeless tobacco: Never  Vaping Use   Vaping status: Never Used  Substance and Sexual Activity  Alcohol use: Never   Drug use: Never   Sexual activity: Yes  Other Topics Concern   Not on file  Social History Narrative   Right handed lives with wife    Social Determinants of Health   Financial Resource Strain: Low Risk  (02/13/2023)   Overall Financial Resource Strain (CARDIA)    Difficulty of Paying Living Expenses: Not hard at all  Food Insecurity: No Food Insecurity (02/13/2023)   Hunger Vital Sign    Worried About Running Out of Food in the Last Year: Never true    Ran Out of Food in the Last Year: Never true  Transportation Needs: No Transportation Needs (02/13/2023)   PRAPARE - Therapist, art (Medical): No    Lack of Transportation (Non-Medical): No  Physical Activity: Inactive (02/13/2023)   Exercise Vital Sign    Days of Exercise per Week: 0 days    Minutes of Exercise per Session: 0 min  Stress: No Stress Concern Present (02/13/2023)   [employer] of Occupational Health - Occupational Stress Questionnaire    Feeling of Stress : Not at all  Social Connections: Moderately Isolated (02/13/2023)   Social Connection and Isolation Panel [NHANES]    Frequency of Communication with Friends and Family: Once a week    Frequency of Social Gatherings with Friends and Family: Never    Attends Religious Services: More than 4 times per year    Active Member of [employer] or Organizations: No    Attends Engineer, structural: Never    Marital Status: Married    Tobacco Counseling Counseling given: Not Answered   Clinical Intake:  Pre-visit preparation completed: Yes  Pain : No/denies pain     Nutritional Risks: None Diabetes: Yes CBG done?: No Did pt. bring in CBG monitor from home?: No  How often do you need to have someone help you when you read instructions, pamphlets, or other written materials from your doctor or pharmacy?: 1 - Never  Interpreter Needed?: No  Information entered by :: NAllen LPN   Activities of Daily Living    02/13/2023   10:57 AM 06/03/2022    1:00 PM  In your present state of health, do you have any difficulty performing the following activities:  Hearing? 0 0  Vision? 0 0  Comment wears glasses   Difficulty concentrating or making decisions? 0 0  Walking or climbing stairs? 0 0  Dressing or bathing? 0 0  Doing errands, shopping? 0 0  Preparing Food and eating ? N   Using the Toilet? N   In the past six months, have you accidently leaked urine? N   Do you have problems with loss of bowel control? N   Managing your Medications? N   Managing your Finances? N   Housekeeping or managing your Housekeeping? N      Patient Care Team: Mliss Sax, MD as PCP - General (Family Medicine) Cherlyn Cushing, RN as Oncology Nurse Navigator Karel Jarvis, Lesle Chris, MD as Consulting Physician (Neurology)  Indicate any recent Medical Services you may have received from other than [facility] providers in the past year (date may be approximate).     Assessment:   This is a routine wellness examination for [place].  Hearing/Vision screen Hearing Screening - Comments:: Denies hearing issues Vision Screening - Comments:: No regular eye exams  Dietary issues and exercise activities discussed:     Goals Addressed  This Visit's Progress    Patient Stated       02/13/2023, wants to get off prostate medication       Depression Screen    02/13/2023   11:04 AM 12/30/2022    1:58 PM 06/09/2022   11:40 AM 05/04/2022   12:08 PM 02/11/2022   11:26 AM 09/17/2021    2:37 PM 08/13/2021    3:47 PM  PHQ 2/9 Scores  PHQ - 2 Score 0 0 0 0 0 0 0  PHQ- 9 Score 0      1    Fall Risk    02/13/2023   11:03 AM 12/30/2022    1:58 PM 06/09/2022   11:40 AM 05/04/2022   12:07 PM 02/11/2022   11:26 AM  Fall Risk   Falls in the past year? 0 0 0 0 0  Number falls in past yr: 0 0 0 0 0  Injury with Fall? 0 0  0   Risk for fall due to : Medication side effect No Fall Risks  No Fall Risks   Follow up Falls prevention discussed;Falls evaluation completed Falls evaluation completed  Falls prevention discussed     MEDICARE RISK AT HOME:  Medicare Risk at Home - 02/13/23 1104     Any stairs in or around the home? Yes    If so, are there any without handrails? No    Home free of loose throw rugs in walkways, pet beds, electrical cords, etc? Yes    Adequate lighting in your home to reduce risk of falls? Yes    Life alert? No    Use of a cane, walker or w/c? No    Grab bars in the bathroom? No    Shower chair or bench in shower? No    Elevated toilet seat or a handicapped toilet? No             TIMED UP AND  GO:  Was the test performed?  No    Cognitive Function:        02/13/2023   11:05 AM 05/04/2022   12:09 PM  6CIT Screen  What Year? 0 points 0 points  What month? 0 points 0 points  What time? 0 points 0 points  Count back from 20 0 points 0 points  Months in reverse 4 points 0 points  Repeat phrase 0 points 0 points  Total Score 4 points 0 points    Immunizations Immunization History  Administered Date(s) Administered   Moderna Sars-Covid-2 Vaccination 10/24/2019, 11/18/2019    TDAP status: Due, Education has been provided regarding the importance of this vaccine. Advised may receive this vaccine at local pharmacy or Health Dept. Aware to provide a copy of the vaccination record if obtained from local pharmacy or Health Dept. Verbalized acceptance and understanding.  Flu Vaccine status: Declined, Education has been provided regarding the importance of this vaccine but patient still declined. Advised may receive this vaccine at local pharmacy or Health Dept. Aware to provide a copy of the vaccination record if obtained from local pharmacy or Health Dept. Verbalized acceptance and understanding.  Pneumococcal vaccine status: Declined,  Education has been provided regarding the importance of this vaccine but patient still declined. Advised may receive this vaccine at local pharmacy or Health Dept. Aware to provide a copy of the vaccination record if obtained from local pharmacy or Health Dept. Verbalized acceptance and understanding.   Covid-19 vaccine status: Information provided on how to obtain vaccines.   Qualifies  for Shingles Vaccine? Yes   Zostavax completed No   Shingrix Completed?: No.    Education has been provided regarding the importance of this vaccine. Patient has been advised to call insurance company to determine out of pocket expense if they have not yet received this vaccine. Advised may also receive vaccine at local pharmacy or Health Dept. Verbalized acceptance  and understanding.  Screening Tests Health Maintenance  Topic Date Due   FOOT EXAM  Never done   Diabetic kidney evaluation - Urine ACR  02/12/2023   OPHTHALMOLOGY EXAM  07/01/2023 (Originally 08/17/2021)   Colonoscopy  12/20/2023 (Originally 09/07/1994)   HEMOGLOBIN A1C  07/01/2023   Diabetic kidney evaluation - eGFR measurement  12/30/2023   Medicare Annual Wellness (AWV)  02/13/2024   Hepatitis C Screening  Completed   HPV VACCINES  Aged Out   DTaP/Tdap/Td  Discontinued   Pneumonia Vaccine 70+ Years old  Discontinued   INFLUENZA VACCINE  Discontinued   COVID-19 Vaccine  Discontinued   Zoster Vaccines- Shingrix  Discontinued    Health Maintenance  Health Maintenance Due  Topic Date Due   FOOT EXAM  Never done   Diabetic kidney evaluation - Urine ACR  02/12/2023    Colorectal cancer screening: declines at this time  Lung Cancer Screening: (Low Dose CT Chest recommended if Age 19-80 years, 20 pack-year currently smoking OR have quit w/in 15years.) does not qualify.   Lung Cancer Screening Referral: no  Additional Screening:  Hepatitis C Screening: does qualify; Completed 12/30/2022  Vision Screening: Recommended annual ophthalmology exams for early detection of glaucoma and other disorders of the eye. Is the patient up to date with their annual eye exam?  No  Who is the provider or what is the name of the office in which the patient attends annual eye exams? none If pt is not established with a provider, would they like to be referred to a provider to establish care? No .   Dental Screening: Recommended annual dental exams for proper oral hygiene  Diabetic Foot Exam: Diabetic Foot Exam: Overdue, Pt has been advised about the importance in completing this exam. Pt is scheduled for diabetic foot exam on next appointment .  Community Resource Referral / Chronic Care Management: CRR required this visit?  No   CCM required this visit?  No     Plan:     I have  personally reviewed and noted the following in the patient's chart:   Medical and social history Use of alcohol, tobacco or illicit drugs  Current medications and supplements including opioid prescriptions. Patient is not currently taking opioid prescriptions. Functional ability and status Nutritional status Physical activity Advanced directives List of other physicians Hospitalizations, surgeries, and ER visits in previous 12 months Vitals Screenings to include cognitive, depression, and falls Referrals and appointments  In addition, I have reviewed and discussed with patient certain preventive protocols, quality metrics, and best practice recommendations. A written personalized care plan for preventive services as well as general preventive health recommendations were provided to patient.     Barb Merino, LPN   07/16/1094   After Visit Summary: (Pick Up) Due to this being a telephonic visit, with patients personalized plan was offered to patient and patient has requested to Pick up at office.  Nurse Notes: none

## 2023-03-24 ENCOUNTER — Encounter: Payer: Self-pay | Admitting: Family Medicine

## 2023-03-24 ENCOUNTER — Ambulatory Visit (INDEPENDENT_AMBULATORY_CARE_PROVIDER_SITE_OTHER): Payer: Medicare HMO | Admitting: Family Medicine

## 2023-03-24 VITALS — BP 110/76 | HR 80 | Temp 97.8°F | Ht 69.0 in | Wt 174.4 lb

## 2023-03-24 DIAGNOSIS — D509 Iron deficiency anemia, unspecified: Secondary | ICD-10-CM

## 2023-03-24 DIAGNOSIS — R0681 Apnea, not elsewhere classified: Secondary | ICD-10-CM

## 2023-03-24 DIAGNOSIS — G2581 Restless legs syndrome: Secondary | ICD-10-CM

## 2023-03-24 DIAGNOSIS — E611 Iron deficiency: Secondary | ICD-10-CM | POA: Insufficient documentation

## 2023-03-24 MED ORDER — GABAPENTIN 300 MG PO CAPS: 300.0000 mg | ORAL_CAPSULE | Freq: Every day | ORAL | 0 refills | Status: DC

## 2023-03-24 NOTE — Progress Notes (Signed)
Established Patient Office Visit   Subjective:  Patient ID: Steven Jensen, male    DOB: 30-Jun-1951  Age: 72 y.o. MRN: [id number]  Chief Complaint  Patient presents with   Sleep Apnea    Sleep disturbance. Pt has trouble breathing while sleeping at time. Pt wake up out of his sleep irate.     HPI Encounter Diagnoses  Name Primary?   Microcytic anemia Yes   Iron deficiency    RLS (restless legs syndrome)    Apnea    Presents today with his wife Claudette who brings in videos of him sleeping.  He is a loud snorer.  The first visit him shows apnea.  The second video shows his legs kicking randomly.  Call dad says that he swings and flails his arms at times.  He wakes up and is irritable but then goes back to sleep.   Review of Systems  Constitutional: Negative.   HENT: Negative.    Eyes:  Negative for blurred vision, discharge and redness.  Respiratory: Negative.    Cardiovascular: Negative.   Gastrointestinal:  Negative for abdominal pain.  Genitourinary: Negative.   Musculoskeletal: Negative.  Negative for myalgias.  Skin:  Negative for rash.  Neurological:  Negative for tingling, loss of consciousness and weakness.  Endo/Heme/Allergies:  Negative for polydipsia.     Current Outpatient Medications:    acetaminophen (TYLENOL) 500 MG tablet, Take 1,000 mg by mouth every 6 (six) hours as needed for mild pain., Disp: , Rfl:    aspirin-acetaminophen-caffeine (EXCEDRIN MIGRAINE) 250-250-65 MG tablet, Take 1 tablet by mouth every 6 (six) hours as needed for headache., Disp: , Rfl:    Calcium Carbonate (CALCIUM 600 PO), Take 600 mg by mouth daily., Disp: , Rfl:    darolutamide (NUBEQA) 300 MG tablet, Take 600 mg by mouth 2 (two) times daily with a meal., Disp: , Rfl:    gabapentin (NEURONTIN) 300 MG capsule, Take 1 capsule (300 mg total) by mouth at bedtime., Disp: 90 capsule, Rfl: 0   Multiple Vitamin (MULTIVITAMIN) capsule, Take 1 capsule by mouth daily. Multi vitamin with  iron, Disp: , Rfl:    pantoprazole (PROTONIX) 40 MG tablet, Take 1 tablet (40 mg total) by mouth daily., Disp: 90 tablet, Rfl: 1   tamsulosin (FLOMAX) 0.4 MG CAPS capsule, Take 1 capsule (0.4 mg total) by mouth daily., Disp: 30 capsule, Rfl: 3   carbamide peroxide (DEBROX) 6.5 % OTIC solution, Place 5 drops into both ears 2 (two) times daily. (Patient not taking: Reported on 02/13/2023), Disp: 15 mL, Rfl: 2   ferrous sulfate 324 MG TBEC, Take 1 tablet (324 mg total) by mouth daily., Disp: 90 tablet, Rfl: 2   metFORMIN (GLUCOPHAGE-XR) 500 MG 24 hr tablet, TAKE 1 TABLET BY MOUTH NIGHTLY AT BEDTIME, Disp: 90 tablet, Rfl: 2   Objective:     BP 110/76   Pulse 80   Temp 97.8 F (36.6 C)   Ht 5\' 9"  (1.753 m)   Wt 174 lb 6.4 oz (79.1 kg)   SpO2 97%   BMI 25.75 kg/m    Physical Exam Constitutional:      General: He is not in acute distress.    Appearance: Normal appearance. He is not ill-appearing, toxic-appearing or diaphoretic.  HENT:     Head: Normocephalic and atraumatic.     Right Ear: External ear normal.     Left Ear: External ear normal.  Eyes:     General: No scleral icterus.  Right eye: No discharge.        Left eye: No discharge.     Extraocular Movements: Extraocular movements intact.     Conjunctiva/sclera: Conjunctivae normal.  Pulmonary:     Effort: Pulmonary effort is normal. No respiratory distress.  Skin:    General: Skin is warm and dry.  Neurological:     Mental Status: He is alert and oriented to person, place, and time.  Psychiatric:        Mood and Affect: Mood normal.        Behavior: Behavior normal.      Results for orders placed or performed in visit on 03/24/23  CBC  Result Value Ref Range   WBC 4.9 3.4 - 10.8 x10E3/uL   RBC 4.86 4.14 - 5.80 x10E6/uL   Hemoglobin 11.4 (L) 13.0 - 17.7 g/dL   Hematocrit 19.1 (L) 47.8 - 51.0 %   MCV 76 (L) 79 - 97 fL   MCH 23.5 (L) 26.6 - 33.0 pg   MCHC 30.8 (L) 31.5 - 35.7 g/dL   RDW 29.5 62.1 - 30.8 %    Platelets 333 150 - 450 x10E3/uL  Iron, TIBC and Ferritin Panel  Result Value Ref Range   Total Iron Binding Capacity 421 250 - 450 ug/dL   UIBC 657 (H) 846 - 962 ug/dL   Iron 59 38 - 952 ug/dL   Iron Saturation 14 (L) 15 - 55 %   Ferritin 31 30 - 400 ng/mL      The 10-year ASCVD risk score (Arnett DK, et al., 2019) is: 19.1%    Assessment & Plan:   Microcytic anemia -     CBC -     Iron, TIBC and Ferritin Panel  Iron deficiency -     CBC -     Iron, TIBC and Ferritin Panel -     Ferrous Sulfate; Take 1 tablet (324 mg total) by mouth daily.  Dispense: 90 tablet; Refill: 2  RLS (restless legs syndrome) -     Gabapentin; Take 1 capsule (300 mg total) by mouth at bedtime.  Dispense: 90 capsule; Refill: 0 -     Ambulatory referral to Pulmonology  Apnea -     Ambulatory referral to Pulmonology    Return Should have appointment scheduled in December for follow-up.Mliss Sax, MD

## 2023-03-25 LAB — CBC
Hematocrit: 37 % — ABNORMAL LOW (ref 37.5–51.0)
Hemoglobin: 11.4 g/dL — ABNORMAL LOW (ref 13.0–17.7)
MCH: 23.5 pg — ABNORMAL LOW (ref 26.6–33.0)
MCHC: 30.8 g/dL — ABNORMAL LOW (ref 31.5–35.7)
MCV: 76 fL — ABNORMAL LOW (ref 79–97)
Platelets: 333 10*3/uL (ref 150–450)
RBC: 4.86 x10E6/uL (ref 4.14–5.80)
RDW: 14.4 % (ref 11.6–15.4)
WBC: 4.9 10*3/uL (ref 3.4–10.8)

## 2023-03-25 LAB — IRON,TIBC AND FERRITIN PANEL
Ferritin: 31 ng/mL (ref 30–400)
Iron Saturation: 14 % — ABNORMAL LOW (ref 15–55)
Iron: 59 ug/dL (ref 38–169)
Total Iron Binding Capacity: 421 ug/dL (ref 250–450)
UIBC: 362 ug/dL — ABNORMAL HIGH (ref 111–343)

## 2023-03-27 ENCOUNTER — Other Ambulatory Visit: Payer: Self-pay | Admitting: Family Medicine

## 2023-03-27 DIAGNOSIS — E119 Type 2 diabetes mellitus without complications: Secondary | ICD-10-CM

## 2023-03-27 MED ORDER — FERROUS SULFATE 324 MG PO TBEC
324.0000 mg | DELAYED_RELEASE_TABLET | Freq: Every day | ORAL | 2 refills | Status: DC
Start: 2023-03-27 — End: 2024-01-08

## 2023-03-27 NOTE — Addendum Note (Signed)
Addended by: Andrez Grime on: 03/27/2023 10:18 AM   Modules accepted: Orders

## 2023-04-23 IMAGING — MR MR HEAD WO/W CM
14 of 16 series · 36 of 48 positions shown · IV contrast (gadavist)
Comparison: None Available.

CLINICAL DATA: Possible seizure, history of prostate cancer

EXAM:
MRI HEAD WITHOUT AND WITH CONTRAST
TECHNIQUE: Multiplanar, multiecho pulse sequences of the brain and surrounding
structures were obtained without and with intravenous contrast.
CONTRAST:  7mL GADAVIST GADOBUTROL 1 MMOL/ML IV SOLN

[Series 7: T2 · sagittal · 5.0mm · 0.47mm/px · 2 of 24 slices shown (1 of 3)]
[im 1/24]
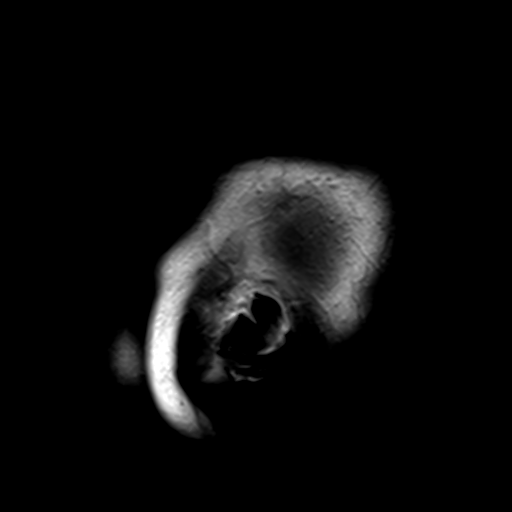
[im 24/24]
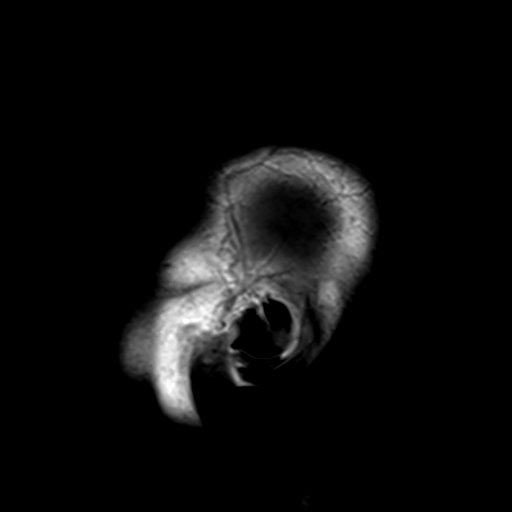

[Series 8: T2 · axial · 5.0mm · 0.45mm/px · z∈[-37,+119]mm · 2 of 25 slices shown (2 of 3)]
[im 1/25]
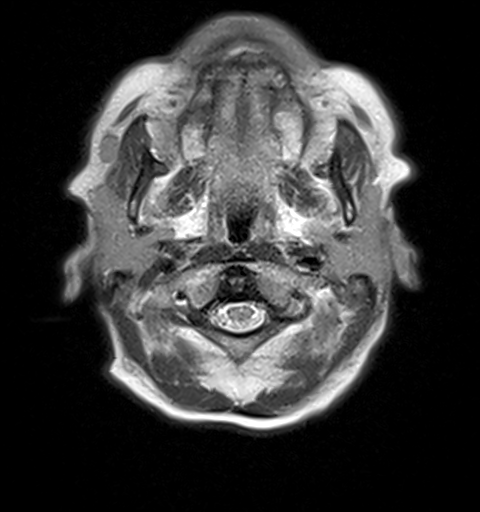
[im 25/25]
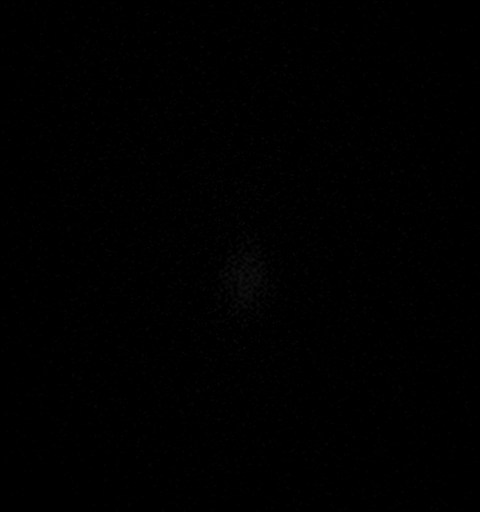

[Series 9: GRE · axial · 3.0mm · 0.45mm/px · z∈[-34,+116]mm · 4 of 51 slices shown]
[im 1/51]
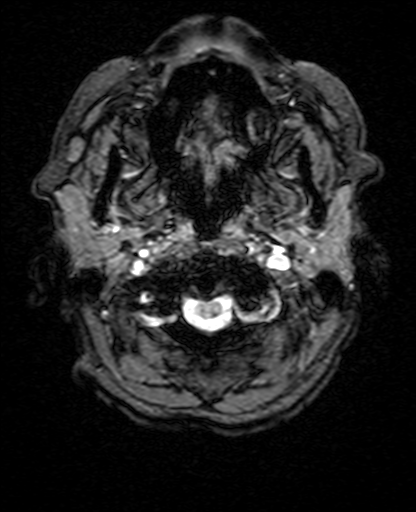
[im 17/51]
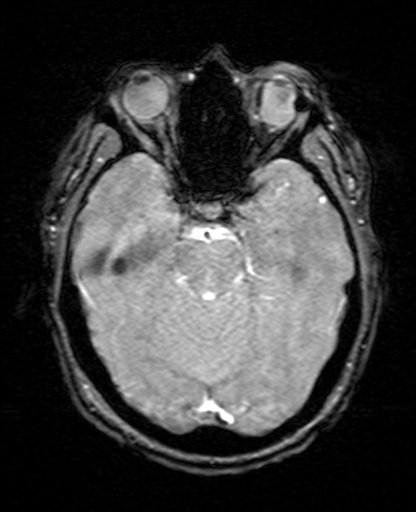
[im 34/51]
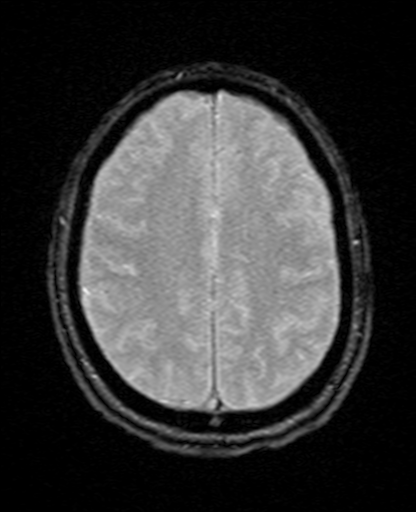
[im 51/51]
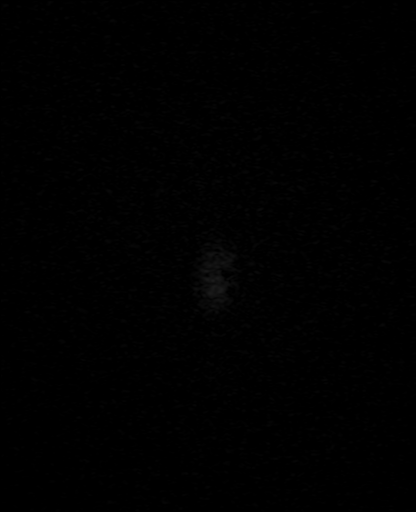

[Series 10: FLAIR · axial · 3.0mm · 0.86mm/px · z∈[-34,+116]mm · 2 of 26 slices shown (1 of 3)]
[im 1/26]
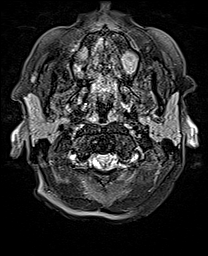
[im 26/26]
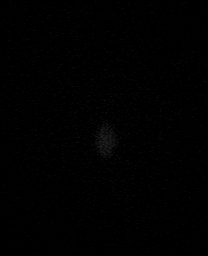

[Series 11: FLAIR · axial · 3.0mm · 0.86mm/px · z∈[-34,+116]mm · 4 of 51 slices shown (2 of 3)]
[im 1/51]
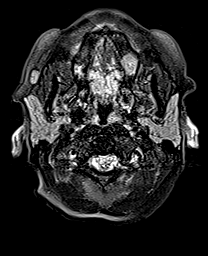
[im 17/51]
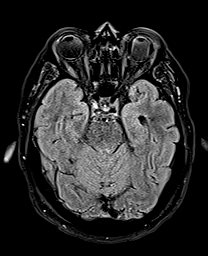
[im 34/51]
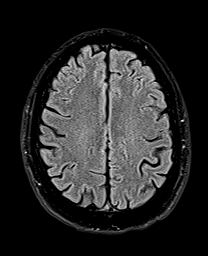
[im 51/51]
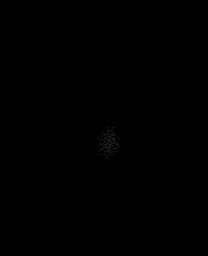

[Series 12: T2 · coronal · 3.0mm · 0.40mm/px · 2 of 34 slices shown (3 of 3)]
[im 1/34]
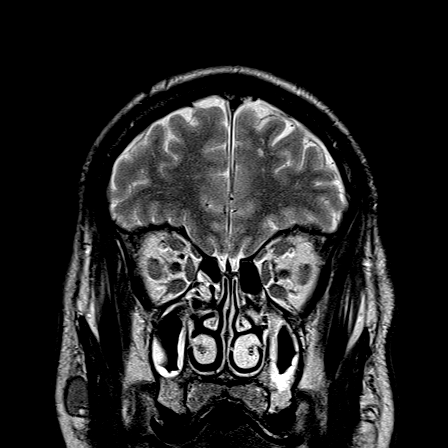
[im 34/34]
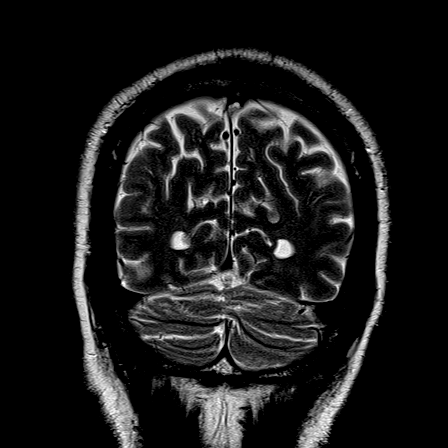

[Series 13: FLAIR · coronal · 3.0mm · 0.56mm/px · 2 of 34 slices shown (3 of 3)]
[im 1/34]
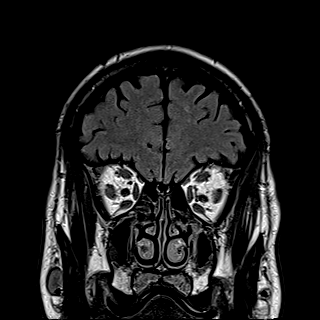
[im 34/34]
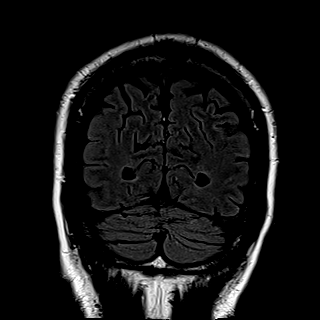

[Series 14: T1 · axial · 3.0mm · 0.45mm/px · z∈[-34,+14]mm · 2 of 51 slices shown]
[im 1/51]
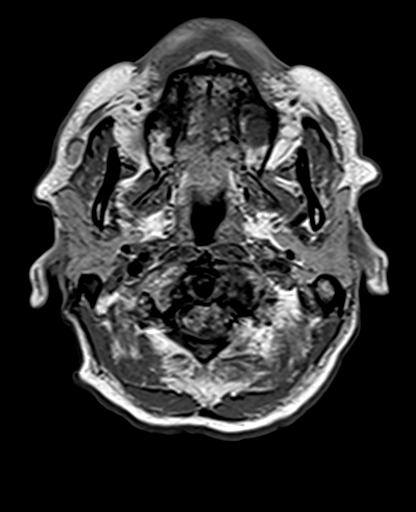
[im 17/51]
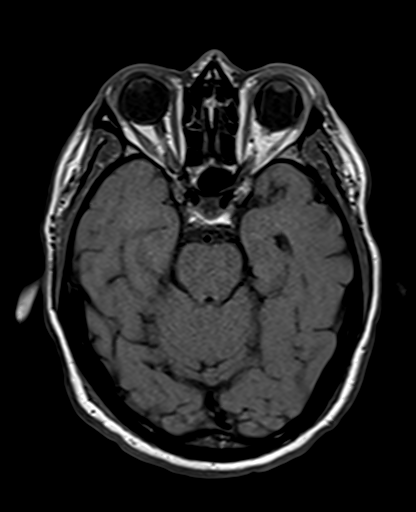

[Series 15: DWI · coronal · 5.0mm · 1.31mm/px · 4 of 64 slices shown (1 of 2)]
[im 1/64]
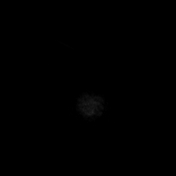
[im 22/64]
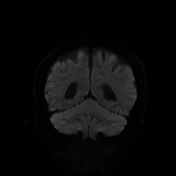
[im 43/64]
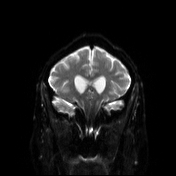
[im 64/64]
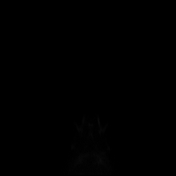

[Series 16: DWI · coronal · 5.0mm · 1.31mm/px · 2 of 32 slices shown (2 of 2)]
[im 1/32]
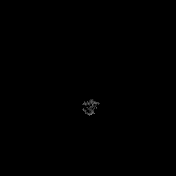
[im 32/32]
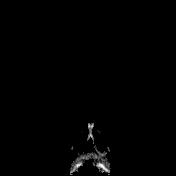

[Series 17: T2 post-contrast · coronal · 5.0mm · 0.86mm/px · 2 of 32 slices shown]
[im 1/32]
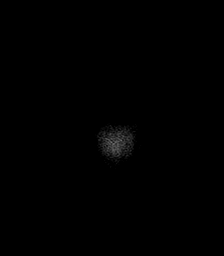
[im 32/32]
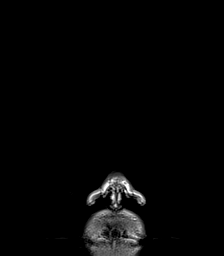

[Series 18: T1 post-contrast · axial · 3.0mm · 0.45mm/px · z∈[-34,+116]mm · 4 of 51 slices shown (1 of 3)]
[im 1/51]
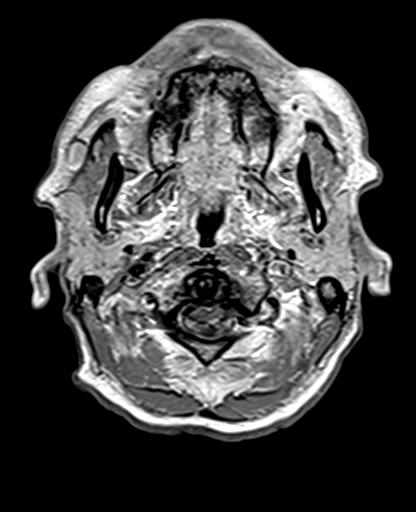
[im 17/51]
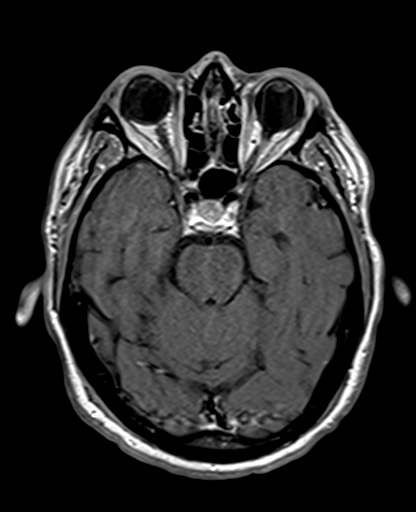
[im 34/51]
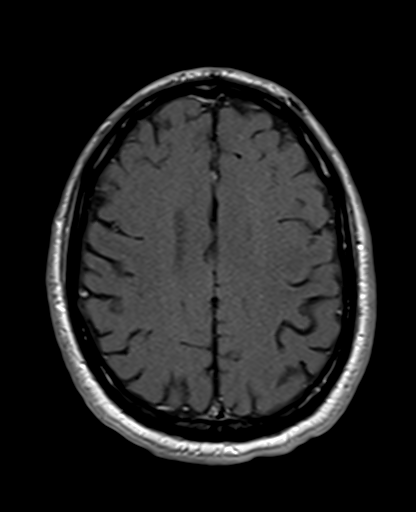
[im 51/51]
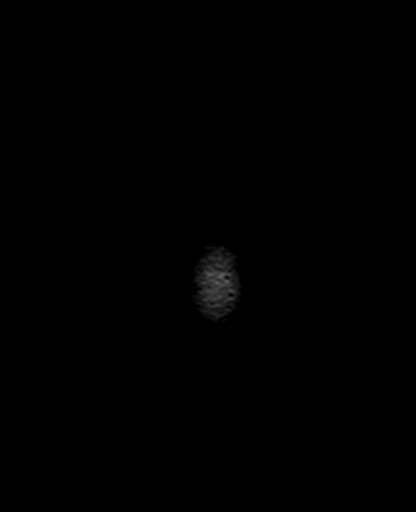

[Series 19: T1 post-contrast · coronal · 5.0mm · 0.43mm/px · 2 of 32 slices shown (2 of 3)]
[im 1/32]
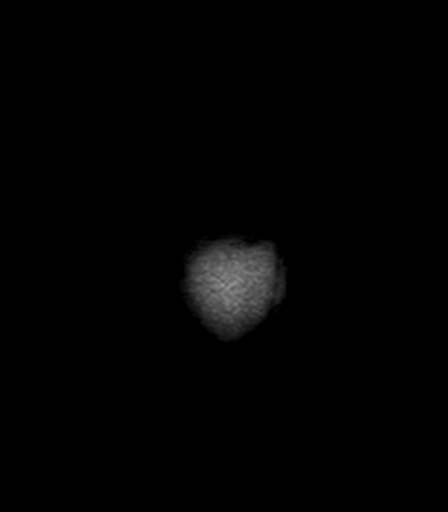
[im 32/32]
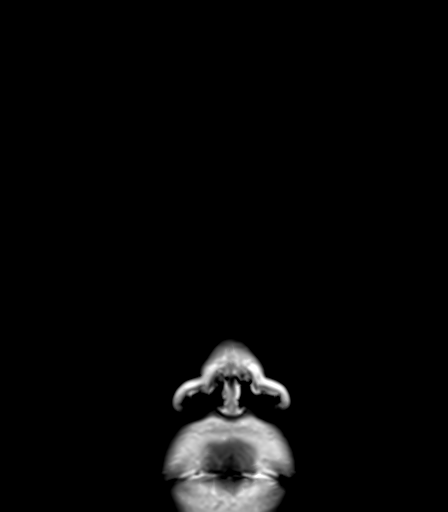

[Series 20: T1 post-contrast · sagittal · 5.0mm · 0.94mm/px · 2 of 24 slices shown (3 of 3)]
[im 1/24]
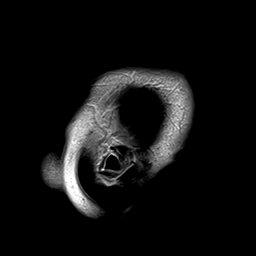
[im 24/24]
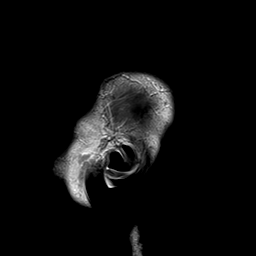

[36 of 48 positions shown; findings below may reference images not displayed]

FINDINGS: Motion artifact is present.

Brain: There is no acute infarction or intracranial hemorrhage.
There is no intracranial mass, mass effect, or edema. There is no
hydrocephalus or extra-axial fluid collection. Prominence of the
ventricles and sulci reflects mild parenchymal volume loss. Punctate
focus of susceptibility in the right cerebellum likely reflects a
chronic microhemorrhage. No abnormal enhancement.

Vascular: Major vessel flow voids at the skull base are preserved.

Skull and upper cervical spine: Marrow signal is within normal
limits.

Sinuses/Orbits: Mild mucosal thickening.  Orbits are unremarkable.

Other: Sella is unremarkable.  Mastoid air cells are clear.
IMPRESSION: No evidence of recent infarction, hemorrhage, or mass. No abnormal
enhancement.

## 2023-04-23 IMAGING — CT CT HEAD W/O CM
4 series · 17 of 47 positions shown, 19 images · non-contrast
Comparison: None Available.

CLINICAL DATA: New onset seizure activity



[Series 2: head wo · axial · 0.51mm/px · z∈[+1534,+1650]mm · 7 of 31 slices shown, 9 images]
[im 4/31  brain]
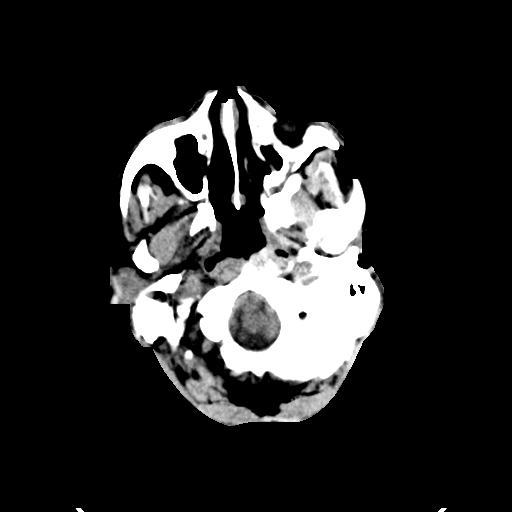
[im 4/31  bone]
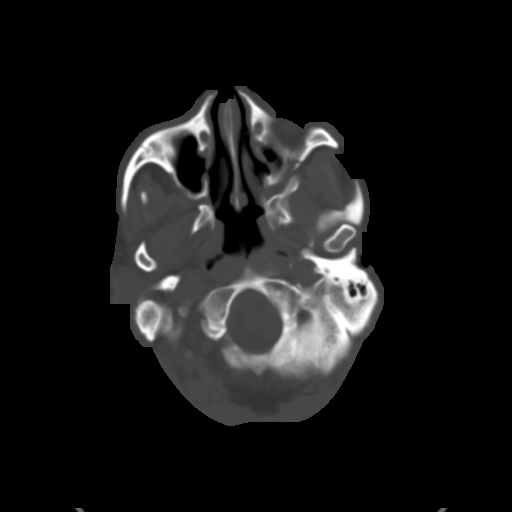
[im 8/31  brain]
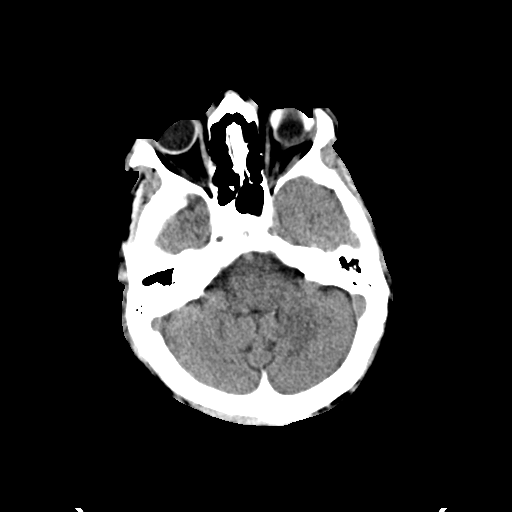
[im 12/31  brain]
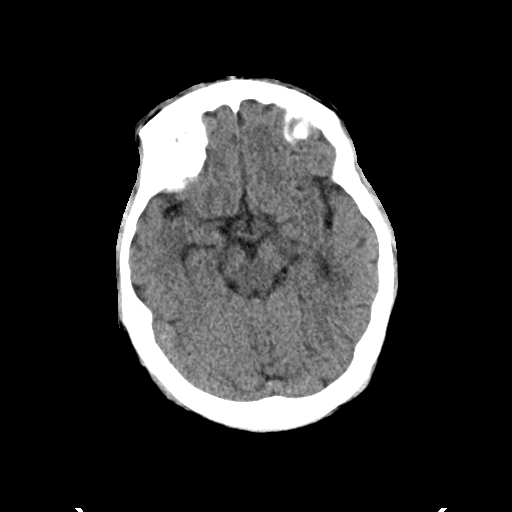
[im 16/31  brain]
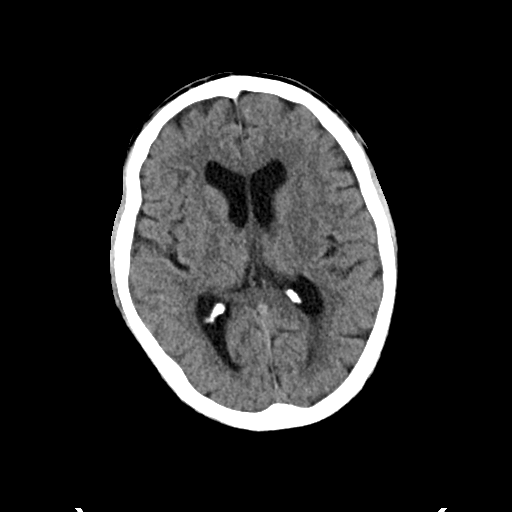
[im 19/31  brain]
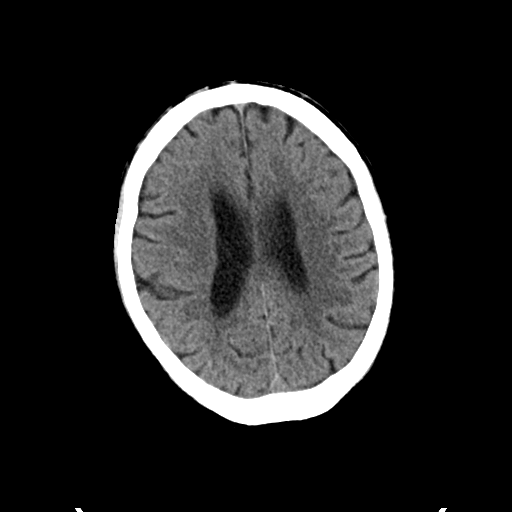
[im 19/31  bone]
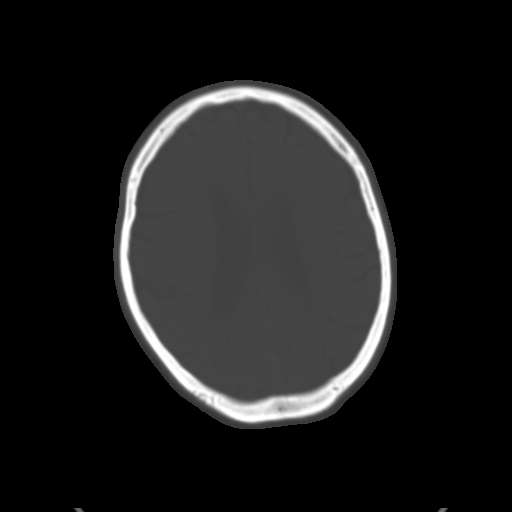
[im 23/31  brain]
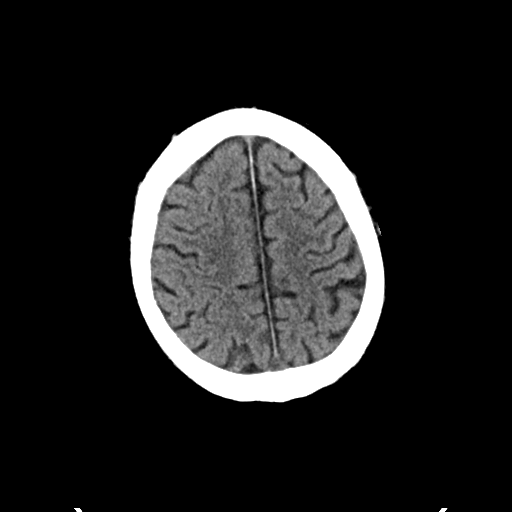
[im 27/31  brain]
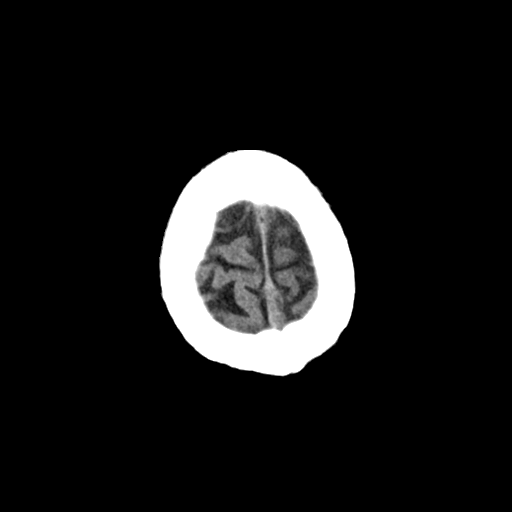

[Series 3: head bone · axial · 0.51mm/px · z∈[+1534,+1588]mm · 4 of 78 slices shown]
[im 8/78  bone]
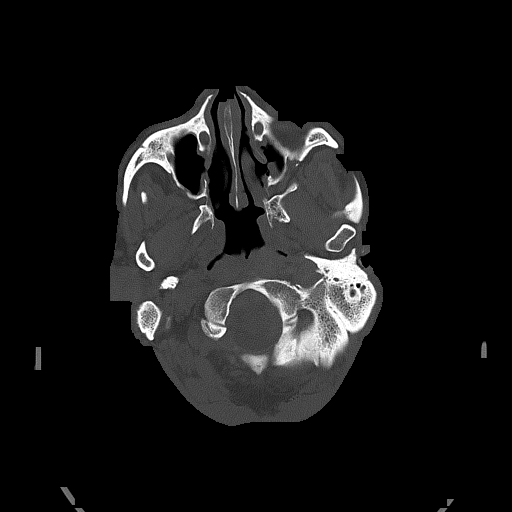
[im 16/78  bone]
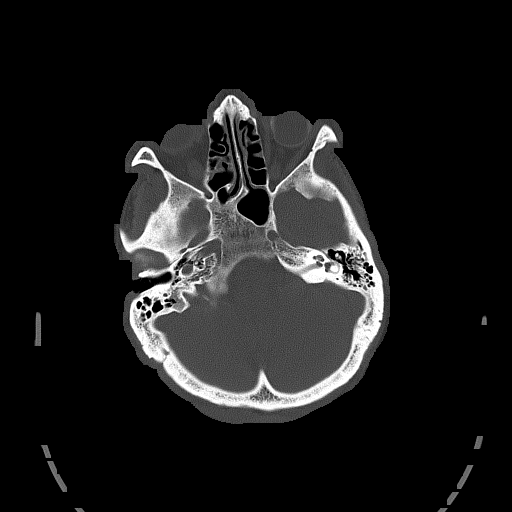
[im 24/78  bone]
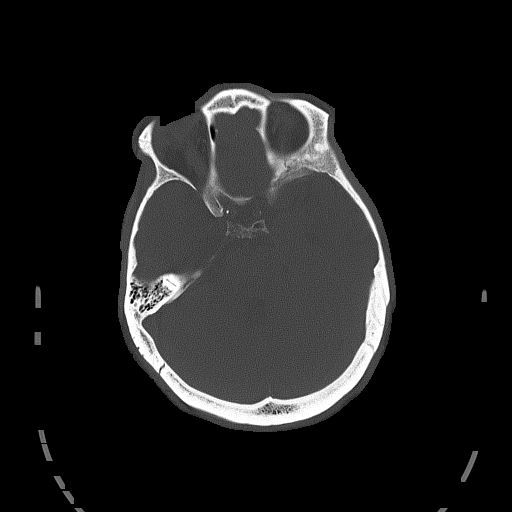
[im 35/78  bone]
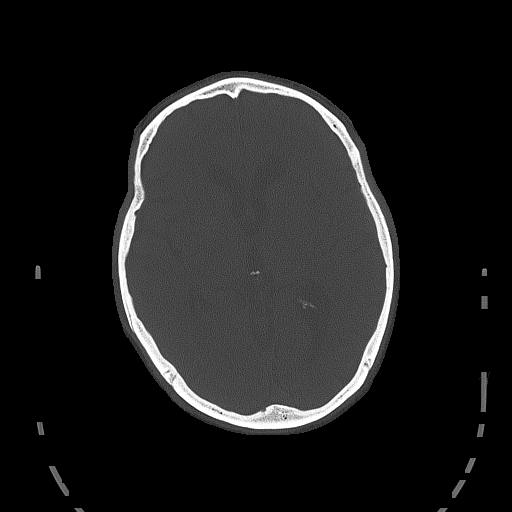

[Series 4: coronal soft tissue · coronal · 0.33mm/px · 3 of 71 slices shown]
[im 24/71  brain]
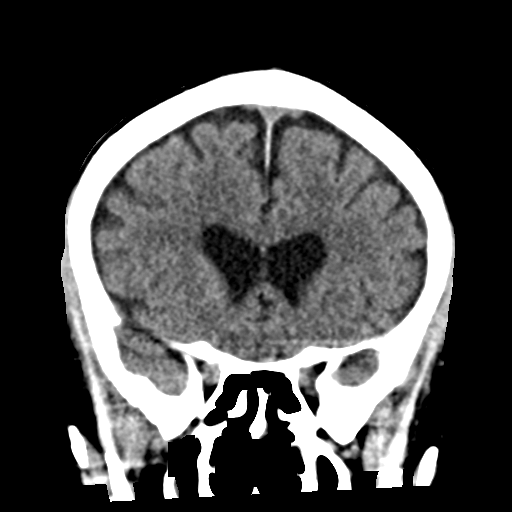
[im 32/71  brain]
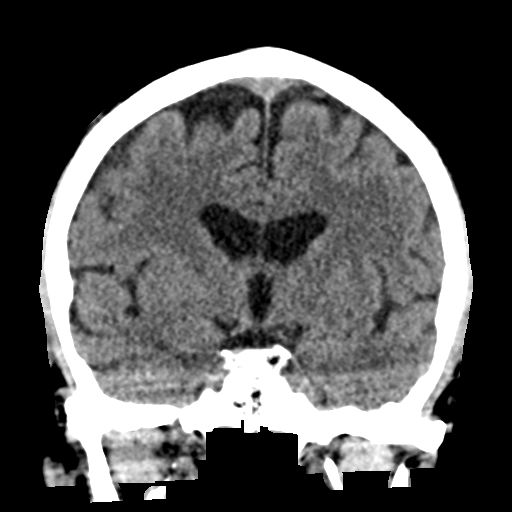
[im 39/71  brain]
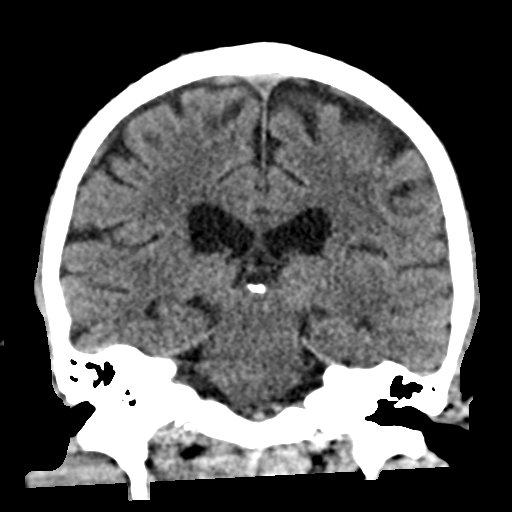

[Series 5: sagittal soft tissue · sagittal · 0.33mm/px · 3 of 57 slices shown]
[im 19/57  brain]
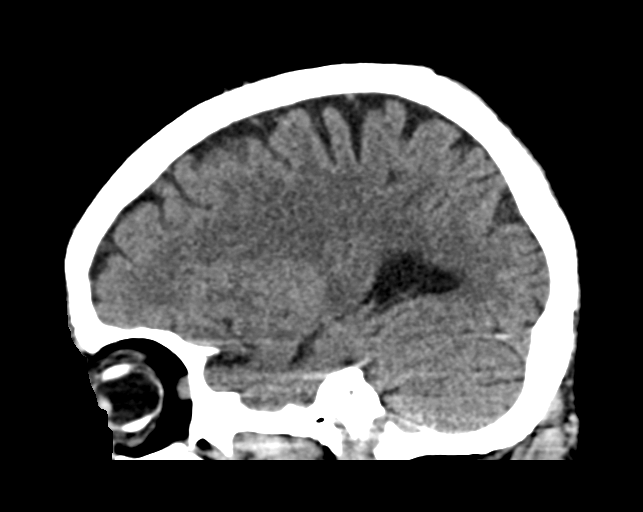
[im 29/57  brain]
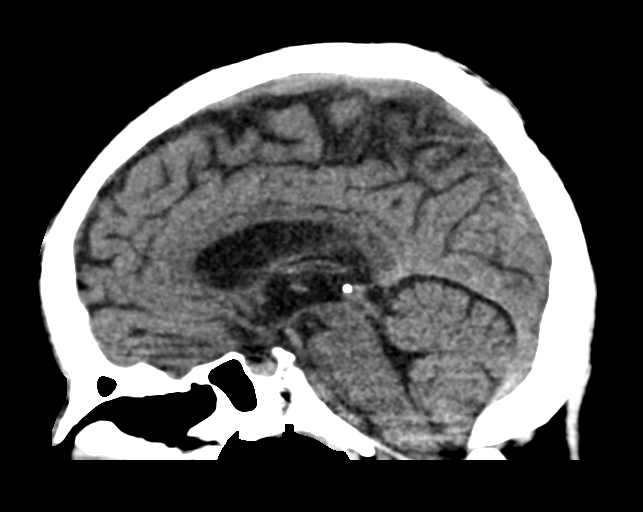
[im 38/57  brain]
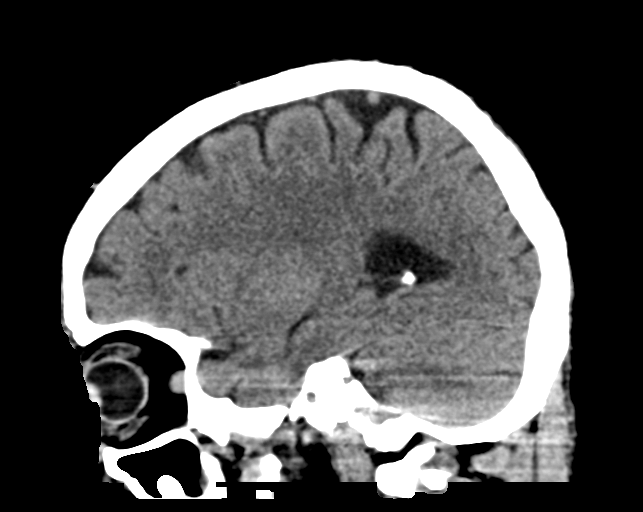

[17 of 47 positions shown; findings below may reference images not displayed]

FINDINGS: Brain: No evidence of acute infarction, hemorrhage, hydrocephalus,
extra-axial collection or mass lesion/mass effect.

Vascular: No hyperdense vessel or unexpected calcification.

Skull: Normal. Negative for fracture or focal lesion.

Sinuses/Orbits: Mild mucosal thickening is noted within the
maxillary antra bilaterally. Orbits and their contents show
postsurgical changes in the left globe.

Other: None
IMPRESSION: No acute intracranial abnormality noted.

Mild mucosal thickening in the paranasal sinuses.

## 2023-05-26 ENCOUNTER — Other Ambulatory Visit: Payer: Self-pay

## 2023-05-26 ENCOUNTER — Institutional Professional Consult (permissible substitution): Payer: Medicare HMO | Admitting: Nurse Practitioner

## 2023-06-16 ENCOUNTER — Ambulatory Visit: Payer: Medicare HMO | Admitting: Family Medicine

## 2023-06-16 ENCOUNTER — Telehealth: Payer: Self-pay | Admitting: Family Medicine

## 2023-06-16 NOTE — Telephone Encounter (Signed)
12.6.24 no show

## 2023-06-16 NOTE — Telephone Encounter (Signed)
Pt was told this appt was cancelled so he didn't come. I told him it wouldn't go against him.

## 2023-06-20 NOTE — Telephone Encounter (Signed)
 Not counted as no show

## 2023-07-07 ENCOUNTER — Telehealth: Payer: Self-pay | Admitting: Family Medicine

## 2023-07-07 ENCOUNTER — Encounter: Payer: Self-pay | Admitting: Family Medicine

## 2023-07-07 ENCOUNTER — Ambulatory Visit: Payer: Medicare HMO | Admitting: Family Medicine

## 2023-07-07 NOTE — Telephone Encounter (Signed)
07/07/2023 1st no show, letter sent via mail, text sent to reschedule

## 2023-07-15 DIAGNOSIS — G4733 Obstructive sleep apnea (adult) (pediatric): Secondary | ICD-10-CM

## 2023-07-28 ENCOUNTER — Encounter: Payer: Self-pay | Admitting: Family Medicine

## 2023-07-28 ENCOUNTER — Ambulatory Visit (INDEPENDENT_AMBULATORY_CARE_PROVIDER_SITE_OTHER): Payer: Medicare HMO | Admitting: Family Medicine

## 2023-07-28 VITALS — BP 114/68 | HR 76 | Temp 98.1°F | Ht 69.0 in | Wt 169.6 lb

## 2023-07-28 DIAGNOSIS — K2960 Other gastritis without bleeding: Secondary | ICD-10-CM

## 2023-07-28 DIAGNOSIS — R0689 Other abnormalities of breathing: Secondary | ICD-10-CM | POA: Insufficient documentation

## 2023-07-28 DIAGNOSIS — J219 Acute bronchiolitis, unspecified: Secondary | ICD-10-CM | POA: Diagnosis not present

## 2023-07-28 DIAGNOSIS — M545 Low back pain, unspecified: Secondary | ICD-10-CM | POA: Insufficient documentation

## 2023-07-28 DIAGNOSIS — G8929 Other chronic pain: Secondary | ICD-10-CM | POA: Insufficient documentation

## 2023-07-28 DIAGNOSIS — T39395A Adverse effect of other nonsteroidal anti-inflammatory drugs [NSAID], initial encounter: Secondary | ICD-10-CM | POA: Diagnosis not present

## 2023-07-28 DIAGNOSIS — J069 Acute upper respiratory infection, unspecified: Secondary | ICD-10-CM | POA: Diagnosis not present

## 2023-07-28 LAB — POC COVID19 BINAXNOW: SARS Coronavirus 2 Ag: NEGATIVE

## 2023-07-28 LAB — POCT INFLUENZA A/B
Influenza A, POC: NEGATIVE
Influenza B, POC: NEGATIVE

## 2023-07-28 MED ORDER — FLUTICASONE PROPIONATE 50 MCG/ACT NA SUSP
2.0000 | Freq: Every day | NASAL | 6 refills | Status: DC
Start: 2023-07-28 — End: 2024-01-05

## 2023-07-28 MED ORDER — DM-GUAIFENESIN ER 30-600 MG PO TB12
1.0000 | ORAL_TABLET | Freq: Two times a day (BID) | ORAL | 0 refills | Status: AC | PRN
Start: 2023-07-28 — End: 2023-08-27

## 2023-07-28 MED ORDER — PROMETHAZINE-DM 6.25-15 MG/5ML PO SYRP
5.0000 mL | ORAL_SOLUTION | Freq: Four times a day (QID) | ORAL | 0 refills | Status: DC | PRN
Start: 2023-07-28 — End: 2024-01-05

## 2023-07-28 MED ORDER — ALBUTEROL SULFATE HFA 108 (90 BASE) MCG/ACT IN AERS
2.0000 | INHALATION_SPRAY | Freq: Four times a day (QID) | RESPIRATORY_TRACT | 0 refills | Status: DC | PRN
Start: 2023-07-28 — End: 2023-08-17

## 2023-07-28 MED ORDER — PANTOPRAZOLE SODIUM 40 MG PO TBEC: 40.0000 mg | DELAYED_RELEASE_TABLET | Freq: Every day | ORAL | 1 refills | Status: DC

## 2023-07-28 MED ORDER — CELECOXIB 100 MG PO CAPS: 100.0000 mg | ORAL_CAPSULE | Freq: Two times a day (BID) | ORAL | 0 refills | Status: AC | PRN

## 2023-07-28 NOTE — Assessment & Plan Note (Signed)
Chronic back pain worsened with coughing; patient reports increased frequency due to current illness.  Plan: To reduce risk for gastritis, recommend Celebrex 100-200 mg twice daily as needed for pain instructions: Use heat therapy and gentle stretching. Avoid activities that exacerbate pain. Follow up with primary care provider if back pain persists or worsens.

## 2023-07-28 NOTE — Patient Instructions (Addendum)
-   Use the albuterol inhaler: take two puffs every six hours as needed for shortness of breath or coughing. - Take Mucinex DM (an over-the-counter medication), one tablet twice daily as needed to help with your cough. - Use Flonase nasal spray as prescribed for nasal congestion. - Restart taking Protonix 40?mg once daily for stomach protection; follow up with Dr. Doreene Burke for future refills. - Increase your fluid intake to stay hydrated. - 6Go to the emergency department for severe symptoms of chest pain, shortness of breath, high fevers or any other worrisome symptoms

## 2023-07-28 NOTE — Assessment & Plan Note (Signed)
History of gastritis exacerbated by NSAID use; patient has run out of Protonix and reports intermittent use of NSAIDs for back pain.  Plan: Protonix 40?mg: Restart taking once daily for stomach protection. Avoid NSAIDs if possible; consider alternative pain management strategies. Follow up with Dr. Doreene Burke for future refills and management.

## 2023-07-28 NOTE — Assessment & Plan Note (Signed)
Patient presents with a 3-day history of cough, weakness, and intermittent nasal congestion. Symptoms began after exposure to a family member with similar illness. COVID-19 and influenza tests are negative.  Plan: Albuterol Inhaler: 2 puffs every 6 hours as needed for shortness of breath or coughing. Mucinex DM (OTC) Flonase Nasal Spray Increase fluid intake to stay hydrated. Rest as needed. Discussed proper inhaler technique. Advised on over-the-counter medication use. Monitor symptoms; return if worsening or not improving in one week. Seek emergency care if symptoms are severe

## 2023-07-28 NOTE — Progress Notes (Signed)
Assessment/Plan:   Problem List Items Addressed This Visit       Respiratory   Viral URI with cough   Patient presents with a 3-day history of cough, weakness, and intermittent nasal congestion. Symptoms began after exposure to a family member with similar illness. COVID-19 and influenza tests are negative.  Plan: Albuterol Inhaler: 2 puffs every 6 hours as needed for shortness of breath or coughing. Mucinex DM (OTC) Flonase Nasal Spray Increase fluid intake to stay hydrated. Rest as needed. Discussed proper inhaler technique. Advised on over-the-counter medication use. Monitor symptoms; return if worsening or not improving in one week. Seek emergency care if symptoms are severe       Relevant Medications   promethazine-dextromethorphan (PROMETHAZINE-DM) 6.25-15 MG/5ML syrup   dextromethorphan-guaiFENesin (MUCINEX DM) 30-600 MG 12hr tablet   albuterol (VENTOLIN HFA) 108 (90 Base) MCG/ACT inhaler   fluticasone (FLONASE) 50 MCG/ACT nasal spray   celecoxib (CELEBREX) 100 MG capsule   Other Relevant Orders   POC COVID-19 (Completed)   POCT Influenza A/B (Completed)   Acute bronchiolitis     Digestive   NSAID induced gastritis   History of gastritis exacerbated by NSAID use; patient has run out of Protonix and reports intermittent use of NSAIDs for back pain.  Plan: Protonix 40?mg: Restart taking once daily for stomach protection. Avoid NSAIDs if possible; consider alternative pain management strategies. Follow up with Dr. Doreene Burke for future refills and management.      Relevant Medications   pantoprazole (PROTONIX) 40 MG tablet     Other   Abnormal breath sounds - Primary   Relevant Medications   albuterol (VENTOLIN HFA) 108 (90 Base) MCG/ACT inhaler   Chronic low back pain without sciatica   Chronic back pain worsened with coughing; patient reports increased frequency due to current illness.  Plan: To reduce risk for gastritis, recommend Celebrex 100-200 mg twice  daily as needed for pain instructions: Use heat therapy and gentle stretching. Avoid activities that exacerbate pain. Follow up with primary care provider if back pain persists or worsens.      Relevant Medications   celecoxib (CELEBREX) 100 MG capsule    Medications Discontinued During This Encounter  Medication Reason   pantoprazole (PROTONIX) 40 MG tablet Reorder    Return if symptoms worsen or fail to improve.    Subjective:   Encounter date: 07/28/2023  Steven Jensen is a 72 y.o. male who has Elevated PSA, greater than or equal to 20 ng/ml; Apnea; RLS (restless legs syndrome); Encounter for hepatitis C screening test for low risk patient; NSAID induced gastritis; Benign prostatic hyperplasia with urinary frequency; Type 2 diabetes mellitus without complication, without long-term current use of insulin (HCC); Microcytic anemia; Prostate cancer (HCC); Shingles; Herpes zoster ophthalmicus of left eye; GERD (gastroesophageal reflux disease); Bilateral impacted cerumen; Iron deficiency; Viral URI with cough; Abnormal breath sounds; Acute bronchiolitis; and Chronic low back pain without sciatica on their problem list..   He  has a past medical history of Cancer (HCC)..   Chief Complaint: Cough and weakness for 3 days.  History of Present Illness:  Patient reports onset of symptoms starting Wednesday night, including cough, weakness, and intermittent nasal congestion. Experienced significant weakness on Thursday morning, requiring bed rest. Denies current chest pain and shortness of breath; however, provider notes prolonged expiratory phase. Admits to not drinking enough fluids. Reports two episodes of watery stools; denies significant diarrhea or blood in stools. No abdominal pain, urinary symptoms, skin rashes, or joint pain. Chronic back  pain exacerbated by coughing. Review of Systems:  General: Weakness present. HEENT: Intermittent nasal congestion. Respiratory: Cough  present; denies chest pain and shortness of breath. Gastrointestinal: Two episodes of watery stools; denies abdominal pain. Genitourinary: Denies dysuria or hematuria. Musculoskeletal: Back pain worsened with coughing. Neurological: Denies headaches or dizziness. Psychiatric: Mood stable. Skin: No rashes. Endocrine: No symptoms reported. Hematologic/Lymphatic: No bleeding or bruising. Allergic/Immunologic: No allergies reported. Remainder of ROS: Negative.   Past Surgical History:  Procedure Laterality Date   EYE SURGERY      Outpatient Medications Prior to Visit  Medication Sig Dispense Refill   acetaminophen (TYLENOL) 500 MG tablet Take 1,000 mg by mouth every 6 (six) hours as needed for mild pain.     aspirin-acetaminophen-caffeine (EXCEDRIN MIGRAINE) 250-250-65 MG tablet Take 1 tablet by mouth every 6 (six) hours as needed for headache.     Calcium Carbonate (CALCIUM 600 PO) Take 600 mg by mouth daily.     carbamide peroxide (DEBROX) 6.5 % OTIC solution Place 5 drops into both ears 2 (two) times daily. 15 mL 2   darolutamide (NUBEQA) 300 MG tablet Take 600 mg by mouth 2 (two) times daily with a meal.     ferrous sulfate 324 MG TBEC Take 1 tablet (324 mg total) by mouth daily. 90 tablet 2   gabapentin (NEURONTIN) 300 MG capsule Take 1 capsule (300 mg total) by mouth at bedtime. 90 capsule 0   metFORMIN (GLUCOPHAGE-XR) 500 MG 24 hr tablet TAKE 1 TABLET BY MOUTH NIGHTLY AT BEDTIME 90 tablet 2   Multiple Vitamin (MULTIVITAMIN) capsule Take 1 capsule by mouth daily. Multi vitamin with iron     tamsulosin (FLOMAX) 0.4 MG CAPS capsule Take 1 capsule (0.4 mg total) by mouth daily. 30 capsule 3   pantoprazole (PROTONIX) 40 MG tablet Take 1 tablet (40 mg total) by mouth daily. 90 tablet 1   No facility-administered medications prior to visit.    Family History  Problem Relation Age of Onset   Cancer Mother    Hypertension Mother    Cancer Sister    Cancer Brother    Hypertension  Brother     Social History   Socioeconomic History   Marital status: Married    Spouse name: Not on file   Number of children: Not on file   Years of education: Not on file   Highest education level: Not on file  Occupational History   Not on file  Tobacco Use   Smoking status: Never   Smokeless tobacco: Never  Vaping Use   Vaping status: Never Used  Substance and Sexual Activity   Alcohol use: Never   Drug use: Never   Sexual activity: Yes  Other Topics Concern   Not on file  Social History Narrative   Right handed lives with wife    Social Drivers of Health   Financial Resource Strain: Low Risk  (02/13/2023)   Overall Financial Resource Strain (CARDIA)    Difficulty of Paying Living Expenses: Not hard at all  Food Insecurity: No Food Insecurity (02/13/2023)   Hunger Vital Sign    Worried About Running Out of Food in the Last Year: Never true    Ran Out of Food in the Last Year: Never true  Transportation Needs: No Transportation Needs (02/13/2023)   PRAPARE - Administrator, Civil Service (Medical): No    Lack of Transportation (Non-Medical): No  Physical Activity: Inactive (02/13/2023)   Exercise Vital Sign    Days  of Exercise per Week: 0 days    Minutes of Exercise per Session: 0 min  Stress: No Stress Concern Present (02/13/2023)   [employer] of Occupational Health - Occupational Stress Questionnaire    Feeling of Stress : Not at all  Social Connections: Moderately Isolated (02/13/2023)   Social Connection and Isolation Panel [NHANES]    Frequency of Communication with Friends and Family: Once a week    Frequency of Social Gatherings with Friends and Family: Never    Attends Religious Services: More than 4 times per year    Active Member of [employer] or Organizations: No    Attends Banker Meetings: Never    Marital Status: Married  Catering manager Violence: Not At Risk (02/13/2023)   Humiliation, Afraid, Rape, and Kick questionnaire    Fear  of Current or Ex-Partner: No    Emotionally Abused: No    Physically Abused: No    Sexually Abused: No                                                                                                  Objective:  Physical Exam: BP 114/68   Pulse 76   Temp 98.1 F (36.7 C) (Temporal)   Ht 5\' 9"  (1.753 m)   Wt 169 lb 9.6 oz (76.9 kg)   SpO2 97%   BMI 25.05 kg/m     Physical Exam Constitutional:      Appearance: Normal appearance.  HENT:     Head: Normocephalic and atraumatic.     Right Ear: Hearing normal.     Left Ear: Hearing normal.     Nose: Nose normal.  Eyes:     General: No scleral icterus.       Right eye: No discharge.        Left eye: No discharge.     Extraocular Movements: Extraocular movements intact.  Cardiovascular:     Rate and Rhythm: Normal rate and regular rhythm.     Heart sounds: Normal heart sounds.  Pulmonary:     Effort: Pulmonary effort is normal. Prolonged expiration present.     Breath sounds: Normal breath sounds.  Abdominal:     Palpations: Abdomen is soft.     Tenderness: There is no abdominal tenderness.  Skin:    General: Skin is warm.     Findings: No rash.  Neurological:     General: No focal deficit present.     Mental Status: He is alert.     Cranial Nerves: No cranial nerve deficit.  Psychiatric:        Mood and Affect: Mood normal.        Behavior: Behavior normal.        Thought Content: Thought content normal.        Judgment: Judgment normal.     No results found.  Recent Results (from the past 2160 hours)  POC COVID-19     Status: Normal   Collection Time: 07/28/23  1:26 PM  Result Value Ref Range   SARS Coronavirus 2 Ag Negative Negative  POCT Influenza A/B  Status: Normal   Collection Time: 07/28/23  1:26 PM  Result Value Ref Range   Influenza A, POC Negative Negative   Influenza B, POC Negative Negative        Garner Nash, MD, MS

## 2023-08-17 ENCOUNTER — Other Ambulatory Visit: Payer: Self-pay

## 2023-08-17 DIAGNOSIS — J069 Acute upper respiratory infection, unspecified: Secondary | ICD-10-CM

## 2023-08-17 DIAGNOSIS — R0689 Other abnormalities of breathing: Secondary | ICD-10-CM

## 2023-08-17 MED ORDER — ALBUTEROL SULFATE HFA 108 (90 BASE) MCG/ACT IN AERS: 2.0000 | INHALATION_SPRAY | Freq: Four times a day (QID) | RESPIRATORY_TRACT | 0 refills | Status: DC | PRN

## 2023-08-17 NOTE — Telephone Encounter (Signed)
 Received rx refill for albuterol  (VENTOLIN  HFA) 108 (90 Base) MCG/ACT inhaler from [name] pharmacy via fax.   LOV: 07/28/2023

## 2023-09-13 DIAGNOSIS — C61 Malignant neoplasm of prostate: Secondary | ICD-10-CM | POA: Diagnosis not present

## 2023-09-15 ENCOUNTER — Institutional Professional Consult (permissible substitution): Payer: Medicare HMO | Admitting: Nurse Practitioner

## 2023-09-15 ENCOUNTER — Other Ambulatory Visit: Payer: Self-pay

## 2023-10-17 ENCOUNTER — Encounter: Payer: Self-pay | Admitting: Oncology

## 2023-12-08 ENCOUNTER — Other Ambulatory Visit: Payer: Self-pay

## 2023-12-08 ENCOUNTER — Ambulatory Visit: Admitting: Adult Health

## 2024-01-05 ENCOUNTER — Ambulatory Visit (INDEPENDENT_AMBULATORY_CARE_PROVIDER_SITE_OTHER): Admitting: Family Medicine

## 2024-01-05 ENCOUNTER — Encounter: Payer: Self-pay | Admitting: Family Medicine

## 2024-01-05 VITALS — BP 114/68 | HR 70 | Temp 97.1°F | Ht 69.0 in | Wt 165.8 lb

## 2024-01-05 DIAGNOSIS — Z7984 Long term (current) use of oral hypoglycemic drugs: Secondary | ICD-10-CM

## 2024-01-05 DIAGNOSIS — E7800 Pure hypercholesterolemia, unspecified: Secondary | ICD-10-CM

## 2024-01-05 DIAGNOSIS — E119 Type 2 diabetes mellitus without complications: Secondary | ICD-10-CM | POA: Diagnosis not present

## 2024-01-05 DIAGNOSIS — E611 Iron deficiency: Secondary | ICD-10-CM

## 2024-01-05 DIAGNOSIS — H6123 Impacted cerumen, bilateral: Secondary | ICD-10-CM

## 2024-01-05 DIAGNOSIS — Z13220 Encounter for screening for lipoid disorders: Secondary | ICD-10-CM

## 2024-01-05 DIAGNOSIS — Z1211 Encounter for screening for malignant neoplasm of colon: Secondary | ICD-10-CM

## 2024-01-05 LAB — URINALYSIS, ROUTINE W REFLEX MICROSCOPIC
Bilirubin Urine: NEGATIVE
Hgb urine dipstick: NEGATIVE
Ketones, ur: NEGATIVE
Leukocytes,Ua: NEGATIVE
Nitrite: NEGATIVE
Specific Gravity, Urine: 1.02 (ref 1.000–1.030)
Total Protein, Urine: NEGATIVE
Urine Glucose: NEGATIVE
Urobilinogen, UA: 1 (ref 0.0–1.0)
WBC, UA: NONE SEEN (ref 0–?)
pH: 6 (ref 5.0–8.0)

## 2024-01-05 LAB — BASIC METABOLIC PANEL WITH GFR
BUN: 14 mg/dL (ref 6–23)
CO2: 30 meq/L (ref 19–32)
Calcium: 9.4 mg/dL (ref 8.4–10.5)
Chloride: 104 meq/L (ref 96–112)
Creatinine, Ser: 0.66 mg/dL (ref 0.40–1.50)
GFR: 92.52 mL/min (ref >60.00)
Glucose, Bld: 97 mg/dL (ref 70–99)
Potassium: 3.9 meq/L (ref 3.5–5.1)
Sodium: 138 meq/L (ref 135–145)

## 2024-01-05 LAB — MICROALBUMIN / CREATININE URINE RATIO
Creatinine,U: 104.2 mg/dL
Microalb Creat Ratio: UNDETERMINED mg/g (ref 0.0–30.0)
Microalb, Ur: <0.7 mg/dL

## 2024-01-05 LAB — CBC
HCT: 37.1 % — ABNORMAL LOW (ref 39.0–52.0)
Hemoglobin: 11.6 g/dL — ABNORMAL LOW (ref 13.0–17.0)
MCHC: 31.2 g/dL (ref 30.0–36.0)
MCV: 73.8 fl — ABNORMAL LOW (ref 78.0–100.0)
Platelets: 321 10*3/uL (ref 150.0–400.0)
RBC: 5.02 Mil/uL (ref 4.22–5.81)
RDW: 14.4 % (ref 11.5–15.5)
WBC: 4.2 10*3/uL (ref 4.0–10.5)

## 2024-01-05 LAB — HEMOGLOBIN A1C: Hgb A1c MFr Bld: 6.3 % (ref 4.6–6.5)

## 2024-01-05 LAB — LDL CHOLESTEROL, DIRECT: Direct LDL: 158 mg/dL

## 2024-01-05 MED ORDER — DEBROX 6.5 % OT SOLN
5.0000 [drp] | Freq: Two times a day (BID) | OTIC | 2 refills | Status: AC
Start: 2024-01-05 — End: ?

## 2024-01-05 NOTE — Progress Notes (Signed)
 Established Patient Office Visit   Subjective:  Patient ID: Steven Jensen, male    DOB: [birth year]  Age: 73 y.o. MRN: [id number]  Chief Complaint  Patient presents with   Ear Problem    Ringing in both for about 1 week 1/2    HPI Encounter Diagnoses  Name Primary?   Type 2 diabetes mellitus without complication, without long-term current use of insulin  (HCC) Yes   Bilateral impacted cerumen    Screening for cholesterol level    Screening for colon cancer    Iron deficiency    For follow-up of above.  Continues metformin  and iron therapy for diabetes and iron deficiency.  He is ready to go for a colonoscopy.  Continues treatment for prostate cancer and has been doing well.  He is back at work at [employer].  He is having a ringing in his ears.  History of ceruminosis.   Review of Systems  Constitutional: Negative.   HENT:  Positive for tinnitus. Negative for ear discharge and ear pain.   Eyes:  Negative for blurred vision, discharge and redness.  Respiratory: Negative.    Cardiovascular: Negative.   Gastrointestinal:  Negative for abdominal pain.  Genitourinary: Negative.   Musculoskeletal: Negative.  Negative for myalgias.  Skin:  Negative for rash.  Neurological:  Negative for tingling, loss of consciousness and weakness.  Endo/Heme/Allergies:  Negative for polydipsia.     Current Outpatient Medications:    acetaminophen  (TYLENOL ) 500 MG tablet, Take 1,000 mg by mouth every 6 (six) hours as needed for mild pain., Disp: , Rfl:    Calcium Carbonate (CALCIUM 600 PO), Take 600 mg by mouth daily., Disp: , Rfl:    metFORMIN  (GLUCOPHAGE -XR) 500 MG 24 hr tablet, TAKE 1 TABLET BY MOUTH NIGHTLY AT BEDTIME, Disp: 90 tablet, Rfl: 2   Multiple Vitamin (MULTIVITAMIN) capsule, Take 1 capsule by mouth daily. Multi vitamin with iron, Disp: , Rfl:    tamsulosin  (FLOMAX ) 0.4 MG CAPS capsule, Take 1 capsule (0.4 mg total) by mouth daily., Disp: 30 capsule, Rfl: 3   carbamide peroxide  (DEBROX) 6.5 % OTIC solution, Place 5 drops into both ears 2 (two) times daily., Disp: 15 mL, Rfl: 2   darolutamide  (NUBEQA ) 300 MG tablet, Take 600 mg by mouth 2 (two) times daily with a meal., Disp: , Rfl:    ferrous sulfate  324 MG TBEC, Take 1 tablet (324 mg total) by mouth daily., Disp: 90 tablet, Rfl: 2   Objective:     BP 114/68 (BP Location: Right Arm, Patient Position: Sitting, Cuff Size: Normal)   Pulse 70   Temp (!) 97.1 F (36.2 C) (Temporal)   Ht 5' 9 (1.753 m)   Wt 165 lb 12.8 oz (75.2 kg)   SpO2 99%   BMI 24.48 kg/m    Physical Exam Constitutional:      General: He is not in acute distress.    Appearance: Normal appearance. He is not ill-appearing, toxic-appearing or diaphoretic.  HENT:     Head: Normocephalic and atraumatic.     Right Ear: External ear normal.     Left Ear: External ear normal.     Mouth/Throat:     Mouth: Mucous membranes are moist.     Pharynx: Oropharynx is clear. No oropharyngeal exudate or posterior oropharyngeal erythema.   Eyes:     General: No scleral icterus.       Right eye: No discharge.        Left eye: No discharge.  Extraocular Movements: Extraocular movements intact.     Conjunctiva/sclera: Conjunctivae normal.     Pupils: Pupils are equal, round, and reactive to light.    Cardiovascular:     Rate and Rhythm: Normal rate and regular rhythm.  Pulmonary:     Effort: Pulmonary effort is normal. No respiratory distress.     Breath sounds: Normal breath sounds.  Abdominal:     General: Bowel sounds are normal.     Tenderness: There is no abdominal tenderness. There is no guarding.   Musculoskeletal:     Cervical back: No rigidity or tenderness.   Skin:    General: Skin is warm and dry.   Neurological:     Mental Status: He is alert and oriented to person, place, and time.   Psychiatric:        Mood and Affect: Mood normal.        Behavior: Behavior normal.      No results found for any visits on  01/05/24.    The 10-year ASCVD risk score (Arnett DK, et al., 2019) is: 20.8%    Assessment & Plan:   Type 2 diabetes mellitus without complication, without long-term current use of insulin  (HCC) -     Microalbumin / creatinine urine ratio -     Basic metabolic panel with GFR -     Hemoglobin A1c -     Urinalysis, Routine w reflex microscopic  Bilateral impacted cerumen -     Debrox; Place 5 drops into both ears 2 (two) times daily.  Dispense: 15 mL; Refill: 2  Screening for cholesterol level -     LDL cholesterol, direct  Screening for colon cancer -     Ambulatory referral to Gastroenterology  Iron deficiency -     CBC -     Iron, TIBC and Ferritin Panel -     Ambulatory referral to Gastroenterology    Return in about 3 months (around 04/06/2024), or Please use Debrox drops and return for irrigation.SABRA Elsie Sim Berneta, MD

## 2024-01-06 ENCOUNTER — Other Ambulatory Visit: Payer: Self-pay

## 2024-01-06 ENCOUNTER — Other Ambulatory Visit: Payer: Self-pay | Admitting: Family Medicine

## 2024-01-06 DIAGNOSIS — E119 Type 2 diabetes mellitus without complications: Secondary | ICD-10-CM

## 2024-01-06 LAB — IRON,TIBC AND FERRITIN PANEL
%SAT: 19 % — ABNORMAL LOW (ref 20–48)
Ferritin: 15 ng/mL — ABNORMAL LOW (ref 24–380)
Iron: 81 ug/dL (ref 50–180)
TIBC: 416 ug/dL (ref 250–425)

## 2024-01-08 ENCOUNTER — Ambulatory Visit: Payer: Self-pay | Admitting: Family Medicine

## 2024-01-08 MED ORDER — ATORVASTATIN CALCIUM 10 MG PO TABS: 10.0000 mg | ORAL_TABLET | Freq: Every day | ORAL | 3 refills | Status: AC

## 2024-01-08 MED ORDER — FERROUS SULFATE 324 MG PO TBEC: 324.0000 mg | DELAYED_RELEASE_TABLET | Freq: Every day | ORAL | 2 refills | Status: AC

## 2024-01-08 NOTE — Addendum Note (Signed)
 Addended by: BERNETA ELSIE LABOR on: 01/08/2024 12:24 PM   Modules accepted: Orders

## 2024-01-09 NOTE — Telephone Encounter (Signed)
 Copied from CRM [id number]. Topic: General - Other >> Jan 09, 2024  9:10 AM Aleatha C wrote: Reason for CRM: Patient want to speak to Dr Berneta Nurse regarding some results

## 2024-01-09 NOTE — Telephone Encounter (Signed)
-----   Message from Tillman JAYSON Flick sent at 01/09/2024  9:16 AM EDT -----

## 2024-01-09 NOTE — Telephone Encounter (Signed)
-----   Message from CMA [place] S sent at 01/09/2024  8:41 AM EDT ----- Please call pt ----- Message ----- From: Berneta Elsie Sayre, MD Sent: 01/08/2024  12:26 PM EDT To: Flonnie CHRISTELLA Norse, CMA  Iron levels remain low.  Please be sure to take the iron tablets daily and twice daily if tolerated.  LDL cholesterol is high and needs to be lower.  Recommending that you start a cholesterol-lowering medicine.  Sent a prescription for atorvastatin to your pharmacy.  Please let me know if you develop  any unusual muscle aches while taking this medication.  Otherwise, I will see you back in 3 months. ----- Message ----- From: Interface, [facility] Sent: 01/05/2024   3:26 PM EDT To: Elsie Sayre Berneta, MD

## 2024-02-14 ENCOUNTER — Other Ambulatory Visit: Payer: Self-pay | Admitting: Family Medicine

## 2024-02-14 DIAGNOSIS — K2960 Other gastritis without bleeding: Secondary | ICD-10-CM

## 2024-03-01 ENCOUNTER — Ambulatory Visit: Admitting: Adult Health

## 2024-03-01 ENCOUNTER — Other Ambulatory Visit: Payer: Self-pay

## 2024-03-18 ENCOUNTER — Telehealth: Payer: Self-pay | Admitting: Family Medicine

## 2024-03-18 ENCOUNTER — Other Ambulatory Visit: Payer: Self-pay | Admitting: Family Medicine

## 2024-03-18 DIAGNOSIS — G2581 Restless legs syndrome: Secondary | ICD-10-CM

## 2024-03-18 DIAGNOSIS — R0681 Apnea, not elsewhere classified: Secondary | ICD-10-CM

## 2024-03-18 NOTE — Telephone Encounter (Signed)
 Pt is needing a new referral sent over to [facility]. He has already scheduled an appt for 04/26/24 with them. He was informed that it needs to be resent. Please advise pt at [phone].

## 2024-03-20 ENCOUNTER — Encounter: Payer: Self-pay | Admitting: Family Medicine

## 2024-04-04 ENCOUNTER — Other Ambulatory Visit: Payer: Self-pay | Admitting: Family Medicine

## 2024-04-04 DIAGNOSIS — E119 Type 2 diabetes mellitus without complications: Secondary | ICD-10-CM

## 2024-04-05 ENCOUNTER — Other Ambulatory Visit: Payer: Self-pay

## 2024-04-05 ENCOUNTER — Ambulatory Visit: Admitting: Family Medicine

## 2024-04-24 ENCOUNTER — Other Ambulatory Visit: Payer: Self-pay

## 2024-04-26 ENCOUNTER — Encounter: Payer: Self-pay | Admitting: Pulmonary Disease

## 2024-04-26 ENCOUNTER — Ambulatory Visit: Admitting: Pulmonary Disease

## 2024-04-26 VITALS — BP 134/81 | HR 71 | Temp 97.4°F | Ht 69.0 in | Wt 165.0 lb

## 2024-04-26 DIAGNOSIS — R0683 Snoring: Secondary | ICD-10-CM | POA: Diagnosis not present

## 2024-04-26 DIAGNOSIS — G4752 REM sleep behavior disorder: Secondary | ICD-10-CM

## 2024-04-26 DIAGNOSIS — G4719 Other hypersomnia: Secondary | ICD-10-CM | POA: Diagnosis not present

## 2024-04-26 NOTE — Progress Notes (Signed)
 Steven Jensen    [id number]    12-17-1950  Primary Care Physician:[name], Elsie Sayre, MD  Referring Physician: Berneta Elsie Sayre, MD [address] [place],  [place] [address]  Chief complaint:   Patient being seen for sleep concerns  HPI:  Accompanied by spouse - Showed me a recording of him out of bed unaware - Happens almost nightly - Bad enough that they sleep in separate rooms - No self-injury - Spouse has been concerned enough to sleep in a separate room  -No memory issues, no history of dementia, no history of neurological problems  History of snoring, witnessed apneas Usually goes to bed about 8 PM, falls asleep quickly About 4 awakenings Final wake up time about 530 Weight has been stable He does have prostate issues/prostate cancer, wakes up about 3 times in the night to use the bathroom  Occasional dryness of his mouth Does have headaches Does have some night sweats Memory is good Usually able to focus  Non-smoker  No family history of sleep disordered breathing   Outpatient Encounter Medications as of 04/26/2024  Medication Sig   acetaminophen  (TYLENOL ) 500 MG tablet Take 1,000 mg by mouth every 6 (six) hours as needed for mild pain.   atorvastatin  (LIPITOR) 10 MG tablet Take 1 tablet (10 mg total) by mouth daily.   Calcium  Carbonate (CALCIUM  600 PO) Take 600 mg by mouth daily.   carbamide peroxide (DEBROX) 6.5 % OTIC solution Place 5 drops into both ears 2 (two) times daily.   darolutamide  (NUBEQA ) 300 MG tablet Take 600 mg by mouth 2 (two) times daily with a meal.   ferrous sulfate  324 MG TBEC Take 1 tablet (324 mg total) by mouth daily.   metFORMIN  (GLUCOPHAGE -XR) 500 MG 24 hr tablet TAKE 1 TABLET BY MOUTH NIGHTLY AT BEDTIME   Multiple Vitamin (MULTIVITAMIN) capsule Take 1 capsule by mouth daily. Multi vitamin with iron   tamsulosin  (FLOMAX ) 0.4 MG CAPS capsule Take 1 capsule (0.4 mg total) by mouth daily.   No  facility-administered encounter medications on file as of 04/26/2024.    Allergies as of 04/26/2024   (No Known Allergies)    Past Medical History:  Diagnosis Date   Cancer [facility])     Past Surgical History:  Procedure Laterality Date   EYE SURGERY      Family History  Problem Relation Age of Onset   Cancer Mother    Hypertension Mother    Cancer Sister    Cancer Brother    Hypertension Brother     Social History   Socioeconomic History   Marital status: Married    Spouse name: Not on file   Number of children: Not on file   Years of education: Not on file   Highest education level: Not on file  Occupational History   Not on file  Tobacco Use   Smoking status: Never   Smokeless tobacco: Never  Vaping Use   Vaping status: Never Used  Substance and Sexual Activity   Alcohol use: Never   Drug use: Never   Sexual activity: Yes  Other Topics Concern   Not on file  Social History Narrative   Right handed lives with wife    Social Drivers of Health   Financial Resource Strain: Low Risk  (02/13/2023)   Overall Financial Resource Strain (CARDIA)    Difficulty of Paying Living Expenses: Not hard at all  Food Insecurity: No Food Insecurity (02/13/2023)  Hunger Vital Sign    Worried About Running Out of Food in the Last Year: Never true    Ran Out of Food in the Last Year: Never true  Transportation Needs: No Transportation Needs (02/13/2023)   PRAPARE - Administrator, Civil Service (Medical): No    Lack of Transportation (Non-Medical): No  Physical Activity: Inactive (02/13/2023)   Exercise Vital Sign    Days of Exercise per Week: 0 days    Minutes of Exercise per Session: 0 min  Stress: No Stress Concern Present (02/13/2023)   [employer] of Occupational Health - Occupational Stress Questionnaire    Feeling of Stress : Not at all  Social Connections: Moderately Isolated (02/13/2023)   Social Connection and Isolation Panel    Frequency of  Communication with Friends and Family: Once a week    Frequency of Social Gatherings with Friends and Family: Never    Attends Religious Services: More than 4 times per year    Active Member of [employer] or Organizations: No    Attends Banker Meetings: Never    Marital Status: Married  Catering manager Violence: Not At Risk (02/13/2023)   Humiliation, Afraid, Rape, and Kick questionnaire    Fear of Current or Ex-Partner: No    Emotionally Abused: No    Physically Abused: No    Sexually Abused: No    Review of Systems  Psychiatric/Behavioral:  Positive for sleep disturbance.     There were no vitals filed for this visit.   Physical Exam Constitutional:      Appearance: Normal appearance.  HENT:     Head: Normocephalic.     Mouth/Throat:     Mouth: Mucous membranes are moist.  Eyes:     General: No scleral icterus. Cardiovascular:     Rate and Rhythm: Normal rate and regular rhythm.     Heart sounds: No murmur heard.    No friction rub.  Pulmonary:     Effort: No respiratory distress.     Breath sounds: No stridor. No wheezing or rhonchi.  Musculoskeletal:     Cervical back: No rigidity or tenderness.  Neurological:     Mental Status: He is alert.  Psychiatric:        Mood and Affect: Mood normal.    Epworth Sleepiness Scale of 11  Data Reviewed: No previous sleep study available  Assessment/Plan: REM behavior disorder - Reviewed the video that the spouse showed me during the visit today -Happens almost nightly - No self-injury reported - Bad enough that they are sleeping in separate rooms  -Options of treatment including melatonin with gradual dose increase, it will start at 5 mg and gradually increase to 50 mg as tolerated - Will take the pill about 30 to 60 minutes before bedtime  - If not effective we can consider Klonopin at a low dose  Snoring, witnessed apneas, daytime sleepiness - Likely related to untreated sleep disordered breathing -  Will benefit from a sleep study - An in-lab sleep study with the most appropriate - May also show us  evidence of his REM behavior disorder  -Will schedule him for an in-lab sleep study  It is important for him to keep a safe environment - This was discussed extensively  Risk with medications to treat his REM behavior disorder also discussed with him today  Treating his sleep apnea may help with his REM behavior disorder   Encouraged to call with significant concerns  Follow-up in about 3  months    Jennet Epley MD [facility] 04/26/2024, 9:09 AM  CC: Berneta Elsie Sayre,*

## 2024-04-26 NOTE — Patient Instructions (Signed)
 REM behavior disorder -Trial with melatonin -Start with 5 mg after about a week if not effective go to 10 mg, go to 15 mg  If despite using 15 mg, you are still having as many symptoms, we can try Klonopin at a low dose  For sleep apnea We will schedule you for a sleep study The sleep lab will call you and we can get this done as soon as possible  Management of sleep apnea is usually started with CPAP - Discussed to be balanced with the risk of injury if you are still moving around a lot Managing sleep apnea may also help/decrease the REM behavior problems  Keep your environment as safe as possible  Tentative follow-up in about 3 months  Call/message if any concerns

## 2024-04-28 ENCOUNTER — Other Ambulatory Visit: Payer: Self-pay

## 2024-05-30 ENCOUNTER — Ambulatory Visit: Admitting: Family Medicine

## 2024-06-29 ENCOUNTER — Other Ambulatory Visit: Payer: Self-pay | Admitting: Family Medicine

## 2024-06-29 DIAGNOSIS — E119 Type 2 diabetes mellitus without complications: Secondary | ICD-10-CM

## 2024-07-05 ENCOUNTER — Ambulatory Visit (HOSPITAL_BASED_OUTPATIENT_CLINIC_OR_DEPARTMENT_OTHER): Attending: Pulmonary Disease | Admitting: Pulmonary Disease

## 2024-07-05 DIAGNOSIS — G4761 Periodic limb movement disorder: Secondary | ICD-10-CM | POA: Insufficient documentation

## 2024-07-05 DIAGNOSIS — R0683 Snoring: Secondary | ICD-10-CM | POA: Diagnosis present

## 2024-07-05 DIAGNOSIS — G4733 Obstructive sleep apnea (adult) (pediatric): Secondary | ICD-10-CM | POA: Insufficient documentation

## 2024-07-05 DIAGNOSIS — G4719 Other hypersomnia: Secondary | ICD-10-CM | POA: Diagnosis not present

## 2024-07-12 ENCOUNTER — Ambulatory Visit: Admitting: Family Medicine

## 2024-07-14 ENCOUNTER — Telehealth: Payer: Self-pay | Admitting: Pulmonary Disease

## 2024-07-14 NOTE — Procedures (Signed)
 " [facility] [address] [place], [place] [address] Tel: [phone]   Fax: [phone]  Polysomnography Interpretation  Patient Name:  Steven Jensen, Steven Jensen Study Date:  07/05/2024 Referring Physician:  JENNET EPLEY [id number]) %%startinterp%% Indications for Polysomnography The patient is a 74 year-old Male who is 5' 9 and weighs 162.0 lbs. His BMI equals 24.0.  A full night polysomnogram was performed to evaluate for -.  Medication  Atorvastatin    Metformin  ER  Melatonin 5 MG  Tamsulosin     Polysomnogram Data A full night polysomnogram recorded the standard physiologic parameters including EEG, EOG, EMG, EKG, nasal and oral airflow.  Respiratory parameters of chest and abdominal movements were recorded with Respiratory Inductance Plethysmography belts.  Oxygen saturation was recorded by pulse oximetry.   Sleep Architecture The total recording time of the polysomnogram was 440.7 minutes.  The total sleep time was 400.5 minutes.  The patient spent 4.2% of total sleep time in Stage N1, 39.3% in Stage N2, 22.1% in Stages N3, and 34.3% in REM.  Sleep latency was 6.3 minutes.  REM latency was 61.0 minutes.  Sleep Efficiency was 90.9%.  Wake after Sleep Onset time was 33.5 minutes.  Respiratory Events The polysomnogram revealed a presence of 18 obstructive, 1 central, and - mixed apneas resulting in an Apnea index of 2.8 events per hour.  There were 45 hypopneas (>=3% desaturation and/or arousal) resulting in an Apnea\Hypopnea Index (AHI >=3% desaturation and/or arousal) of 9.6 events per hour.  There were 14 hypopneas (>=4% desaturation) resulting in an Apnea\Hypopnea Index (AHI >=4% desaturation) of 4.9 events per hour.  There were 6 Respiratory Effort Related Arousals resulting in a RERA index of 0.9 events per hour. The Respiratory Disturbance Index is 10.5 events per hour.  The snore index was 10.8 events per hour.  Mean oxygen saturation was 96.8%.   The lowest oxygen saturation during sleep was 90.0%.  Time spent <=88% oxygen saturation was 0.2 minutes (-).  Limb Activity There were 120 total limb movements recorded, of this total, 116 were classified as PLMs.  PLM index was 17.4 per hour and PLM associated with Arousals index was 0.6 per hour.  Cardiac Summary The average pulse rate was 71.3 bpm.  The minimum pulse rate was 65.0 bpm while the maximum pulse rate was 94.0 bpm.  Cardiac rhythm was normal/abnormal.  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  With oxygen nadir of 90%.   Sleep efficiency was excellent Sleep architecture was fair with increased REM sleep percentage Sleep talking was noted during REM episodes Mild periodic limb movement Cardiac rhythm was sinus Recommendations: Options for treating mild obstructive sleep apnea may include CPAP therapy if there are significant daytime symptoms or notable comorbidities. Auto CPAP 5-15 with heated humidification and the patient's preferred mask may be considered; other treatment options may include an oral device, watchful waiting with significant weight loss efforts. Avoid alcohol, sedatives and other CNS depressants that may worsen sleep apnea and disrupt normal sleep architecture. Sleep hygiene should be reviewed to assess factors that may improve sleep quality. 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  This study was personally reviewed and electronically signed by:  Jennet Epley, MD Accredited Board Certified in Sleep Medicine Date/Time:   07/14/24  %%endinterp%%  Diagnostic PSG Report  Patient Name: Steven Jensen, Steven Jensen Study Date: 07/05/2024  Date of Birth: 08-Feb-1951 Study Type: Diagnostic  Age: 74  year MRN #: [id number]  Sex: Male Interpreting Physician: NEDA HAMMOND [id number]  Height: 5' 9 Referring Physician: HAMMOND NEDA [phone])  Weight: 162.0 lbs Recording Tech: Holly Neeriemer RPSGT RST  BMI: 24.0 Scoring Tech: Holly Neeriemer RPSGT RST  ESS: 2 Neck Size: 15   Study Overview  Lights Off: 09:32:17 PM  Count Index  Lights On: 04:52:58 AM Awakenings: 21 3.1  Time in Bed: 440.7 min. Arousals: 102 15.3  Total Sleep Time: 400.5 min. AHI (>=3% Desat and/or Ar.): 64 9.6   Sleep Efficiency: 90.9% AHI (>=4% Desat): 33 4.9   Sleep Latency: 6.3 min. Limb Movements: 120 18.0  Wake After Sleep Onset: 33.5 min. Snore: 72 10.8  REM Latency from Sleep Onset: 61.0 min. Desaturations: 63 9.4     Minimum SpO2 TST: 90.0%    Sleep Architecture  % of Time in Bed Stages Time (mins) % Sleep Time  Wake 40.5   Stage N1 17.0 4.2%  Stage N2 157.5 39.3%  Stage N3 88.5 22.1%  REM 137.5 34.3%   Arousal Summary   NREM REM Sleep Index  Respiratory Arousals 6 6 12  1.8  PLM Arousals 4 - 4 0.6  Isolated Limb Movement Arousals - - - -  Snore Arousals 6 3 9  1.3  Spontaneous Arousals 33 44 77 11.5  Total 49 53 102 15.3   Limb Movement Summary   Count Index  Isolated Limb Movements 4 0.6  Periodic Limb Movements (PLMs) 116 17.4  Total Limb Movements 120 18.0    Respiratory Summary   By Sleep Stage By Body Position Total   NREM REM Supine Non-Supine   Time (min) 263.0 137.5 80.0 320.5 400.5         Obstructive Apnea 16 2 18  - 18  Mixed Apnea - - - - -  Central Apnea 1 - 1 - 1  Total Apneas 17 2 19  - 19  Total Apnea Index 3.9 0.9 14.3 - 2.8         Hypopneas (>=3% Desat and/or Ar.) 29 16 23 22  45  AHI (>=3% Desat and/or Ar.) 10.5 7.9 31.5 4.1 9.6         Hypopneas (>=4% Desat) 8 6 10 4 14   AHI (>=4% Desat) 5.7 3.5 21.8 0.7 4.9          RERAs 2 4 2 4 6   RERA Index 0.5 1.7 1.5 0.7 0.9         RDI 11.0 9.6 33.0 4.9 10.5    Respiratory Event Type Index  Central Apneas 0.1   Obstructive Apneas 2.7  Mixed Apneas -  Central Hypopneas -  Obstructive Hypopneas 7.0  Central Apnea + Hypopnea (CAHI) 0.1  Obstructive Apnea + Hypopnea (OAHI) 9.7   Respiratory Event Durations   Apnea Hypopnea   NREM REM NREM REM  Average (seconds) 16.5 15.4 19.8 20.7  Maximum (seconds) 21.5 15.7 27.8 28.5    Oxygen Saturation Summary   Wake NREM REM TST TIB  Average SpO2 (%) 96.5% 96.5% 97.4% 96.9% 96.8%  Minimum SpO2 (%) 76.0% 90.0% 92.0% 90.0% 76.0%  Maximum SpO2 (%) 100.0% 100.0% 100.0% 100.0% 100.0%   Oxygen Saturation Distribution  Range (%) Time in range (min) Time in range (%)  90.0 - 100.0 432.6 99.9%  80.0 - 90.0 0.2 0.1%  70.0 - 80.0 0.0 0.0%  60.0 - 70.0 - -  50.0 - 60.0 - -  0.0 - 50.0 - -  Time Spent <=88% SpO2  Range (%) Time in range (min) Time in range (%)  0.0 - 88.0 0.2 0.0%      Count Index  Desaturations 63 9.4    Cardiac Summary   Wake NREM REM Sleep Total  Average Pulse Rate (BPM) 77.1 70.6 71.2 70.8 71.3  Minimum Pulse Rate (BPM) 68.0 65.0 65.0 65.0 65.0  Maximum Pulse Rate (BPM) 94.0 83.0 79.0 83.0 94.0   Pulse Rate Distribution:  Range (bpm) Time in range (min) Time in range (%)  0.0 - 40.0 - -  40.0 - 60.0 - -  60.0 - 80.0 426.1 98.5%  80.0 - 100.0 6.6 1.5%  100.0 - 120.0 - -  120.0 - 140.0 - -  140.0 - 200.0 - -      Hypnograms                      Technologist Comments  The 74 y/o male was seen in the [facility] for a Diagnostic NPSG. The associated diagnosis are snoring and excessive daytime sleepiness.  The study was performed in sleep room #4.   The patient arrived without complaint. The study process was explained and leads were applied.  The patient was observed sleeping supine, L & R lateral. Snore was noted to be mild to moderate and continuous most of the time. Apneas and hypopneas were observed, RERAs were observed. Talking in REM was noted during two REM episodes.   1 restroom visit  occurred during the recording. The patient self administered his medications at 2015.  ECG appeared to be NSR without obvious arrhythmias via 2 lead monitoring PLMs/PLMAs were moderate No supplemental O2 was applied. The patient was observed talking during REM.      "

## 2024-07-14 NOTE — Procedures (Signed)
" °  Indications for Polysomnography The patient is a 74 year-old Male who is 5' 9 and weighs 162.0 lbs. His BMI equals 24.0.  A full night polysomnogram was performed to evaluate for -.  MedicationAtorvastatin Metformin  ERMelatonin 5 MGTamsulosin Polysomnogram Data A full night polysomnogram recorded the standard physiologic parameters including EEG, EOG, EMG, EKG, nasal and oral airflow.  Respiratory parameters of chest and abdominal movements were recorded with Respiratory Inductance Plethysmography belts.   Oxygen saturation was recorded by pulse oximetry.  Sleep Architecture The total recording time of the polysomnogram was 440.7 minutes.  The total sleep time was 400.5 minutes.  The patient spent 4.2% of total sleep time in Stage N1, 39.3% in Stage N2, 22.1% in Stages N3, and 34.3% in REM.  Sleep latency was 6.3 minutes.   REM latency was 61.0 minutes.  Sleep Efficiency was 90.9%.  Wake after Sleep Onset time was 33.5 minutes.  Respiratory Events The polysomnogram revealed a presence of 18 obstructive, 1 central, and - mixed apneas resulting in an Apnea index of 2.8 events per hour.  There were 45 hypopneas (GreaterEqual to3% desaturation and/or arousal) resulting in an Apnea\Hypopnea Index (AHI  GreaterEqual to3% desaturation and/or arousal) of 9.6 events per hour.  There were 14 hypopneas (GreaterEqual to4% desaturation) resulting in an Apnea\Hypopnea Index (AHI GreaterEqual to4% desaturation) of 4.9 events per hour.  There were 6 Respiratory  Effort Related Arousals resulting in a RERA index of 0.9 events per hour. The Respiratory Disturbance Index is 10.5 events per hour.  The snore index was 10.8 events per hour.  Mean oxygen saturation was 96.8%.  The lowest oxygen saturation during sleep was 90.0%.  Time spent LessEqual to88% oxygen saturation was  minutes ().  Limb Activity There were 120 total limb movements recorded, of this total, 116 were classified as PLMs.  PLM index was 17.4  per hour and PLM associated with Arousals index was 0.6 per hour.  Cardiac Summary The average pulse rate was 71.3 bpm.  The minimum pulse rate was 65.0 bpm while the maximum pulse rate was 94.0 bpm.  Cardiac rhythm was normal/abnormal.  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  With oxygen nadir of 90%. Sleep efficiency was excellent Sleep architecture was fair with increased REM sleep percentage Sleep talking was noted during REM episodes Mild periodic limb movement Cardiac rhythm was sinus Recommendations: Options for treating mild obstructive sleep apnea may include CPAP therapy if there are significant daytime symptoms or notable comorbidities. Auto CPAP 5-15 with heated humidification and the patient's preferred mask may be considered; other treatment  options may include an oral device, watchful waiting with significant weight loss efforts. Avoid alcohol, sedatives and other CNS depressants that may worsen sleep apnea and disrupt normal sleep architecture. Sleep hygiene should be reviewed to assess factors that may improve sleep quality. 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

## 2024-07-14 NOTE — Telephone Encounter (Signed)
 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  With oxygen nadir of 90%.   Sleep architecture was fair with increased REM sleep percentage Sleep talking was noted during REM episodes Mild periodic limb movement Cardiac rhythm was sinus  Recommendation: Options for treating mild obstructive sleep apnea may include CPAP therapy if there are significant daytime symptoms or notable comorbidities. Auto CPAP 5-15 with heated humidification and the patient's preferred mask may be considered; other treatment options may include an oral device, watchful waiting with significant weight loss efforts. Avoid alcohol, sedatives and other CNS depressants that may worsen sleep apnea and disrupt normal sleep architecture. Sleep hygiene should be reviewed to assess factors that may improve sleep quality. Weight management and regular exercise should be initiated or continued  Continue ongoing management  Schedule patient for follow-up discussion regarding sleep study findings

## 2024-07-17 ENCOUNTER — Ambulatory Visit
Admission: EM | Admit: 2024-07-17 | Discharge: 2024-07-17 | Disposition: A | Discharge status: Home / Self Care | Attending: Student | Admitting: Student

## 2024-07-17 ENCOUNTER — Encounter: Payer: Self-pay | Admitting: Emergency Medicine

## 2024-07-17 ENCOUNTER — Ambulatory Visit: Payer: Self-pay

## 2024-07-17 DIAGNOSIS — R0602 Shortness of breath: Secondary | ICD-10-CM

## 2024-07-17 DIAGNOSIS — R0789 Other chest pain: Secondary | ICD-10-CM

## 2024-07-17 LAB — POCT INFLUENZA A/B
Influenza A, POC: POSITIVE — AB
Influenza B, POC: NEGATIVE

## 2024-07-17 NOTE — ED Triage Notes (Addendum)
 Pt c/o cough, chest pain when he sneezes and coughs, fatigue, left arm pain, sob, and right posterior calf pain

## 2024-07-17 NOTE — ED Provider Notes (Signed)
 " [facility]    CSN: [id number] Arrival date & time: 07/17/24  1843      History   Chief Complaint Chief Complaint  Patient presents with   Shortness of Breath   Cough   Chest Pain    HPI Steven Jensen is a 74 y.o. male presenting with shortness of breath, chest pain, and cough. PCP advised ER. -Symptoms x24 hours. -Chest wall pain worse with coughing.  -DOE with exertion and movement. Minimal SOB at rest.  -Denies current left arm pain: states does many repetitive motions at work -Left calf pain, worse with movement. -Fatigue, body aches  The patient denies a history of pulmonary disease   HPI  Past Medical History:  Diagnosis Date   Cancer [facility])     Patient Active Problem List   Diagnosis Date Noted   Viral URI with cough 07/28/2023   Abnormal breath sounds 07/28/2023   Acute bronchiolitis 07/28/2023   Chronic low back pain without sciatica 07/28/2023   Iron deficiency 03/24/2023   Bilateral impacted cerumen 12/30/2022   Shingles 06/03/2022   Herpes zoster ophthalmicus of left eye 06/03/2022   GERD (gastroesophageal reflux disease) 06/03/2022   Prostate cancer (HCC) 10/22/2021   Type 2 diabetes mellitus without complication, without long-term current use of insulin  (HCC) 08/17/2021   Microcytic anemia 08/17/2021   Elevated PSA, greater than or equal to 20 ng/ml 08/16/2021   Apnea 08/16/2021   RLS (restless legs syndrome) 08/16/2021   Encounter for hepatitis C screening test for low risk patient 08/16/2021   NSAID induced gastritis 08/16/2021   Benign prostatic hyperplasia with urinary frequency 08/16/2021    Past Surgical History:  Procedure Laterality Date   EYE SURGERY         Home Medications    Prior to Admission medications  Medication Sig Start Date End Date Taking? Authorizing Provider  acetaminophen  (TYLENOL ) 500 MG tablet Take 1,000 mg by mouth every 6 (six) hours as needed for mild pain.    [provider]   atorvastatin  (LIPITOR) 10 MG tablet Take 1 tablet (10 mg total) by mouth daily. 01/08/24   Berneta Elsie Sayre, MD  Calcium  Carbonate (CALCIUM  600 PO) Take 600 mg by mouth daily.    [provider]  carbamide peroxide (DEBROX) 6.5 % OTIC solution Place 5 drops into both ears 2 (two) times daily. Patient not taking: Reported on 04/26/2024 01/05/24   Berneta Elsie Sayre, MD  darolutamide  (NUBEQA ) 300 MG tablet Take 600 mg by mouth 2 (two) times daily with a meal.    [provider]  ferrous sulfate  324 MG TBEC Take 1 tablet (324 mg total) by mouth daily. 01/08/24   Berneta Elsie Sayre, MD  metFORMIN  (GLUCOPHAGE -XR) 500 MG 24 hr tablet TAKE 1 TABLET BY MOUTH NIGHTLY AT BEDTIME 07/01/24   Berneta Elsie Sayre, MD  Multiple Vitamin (MULTIVITAMIN) capsule Take 1 capsule by mouth daily. Multi vitamin with iron    [provider]  tamsulosin  (FLOMAX ) 0.4 MG CAPS capsule Take 1 capsule (0.4 mg total) by mouth daily. 12/30/22   Berneta Elsie Sayre, MD  XTANDI 40 MG tablet Take 80 mg by mouth 2 (two) times daily. 04/22/24   [provider]    Family History Family History  Problem Relation Age of Onset   Cancer Mother    Hypertension Mother    Cancer Sister    Cancer Brother    Hypertension Brother     Social History Social History[1]   Allergies  Patient has no known allergies.   Review of Systems Review of Systems  Constitutional:  Positive for fatigue. Negative for appetite change, chills and fever.  HENT:  Positive for congestion. Negative for ear pain, rhinorrhea, sinus pressure, sinus pain and sore throat.   Eyes:  Negative for redness and visual disturbance.  Respiratory:  Positive for cough and shortness of breath. Negative for chest tightness and wheezing.   Cardiovascular:  Positive for chest pain. Negative for palpitations.  Gastrointestinal:  Negative for abdominal pain, constipation, diarrhea, nausea and vomiting.   Genitourinary:  Negative for dysuria, frequency and urgency.  Musculoskeletal:  Positive for myalgias.       R calf pain  Neurological:  Negative for dizziness, weakness and headaches.  Psychiatric/Behavioral:  Negative for confusion.   All other systems reviewed and are negative.    Physical Exam Triage Vital Signs ED Triage Vitals  Encounter Vitals Group     BP      Girls Systolic BP Percentile      Girls Diastolic BP Percentile      Boys Systolic BP Percentile      Boys Diastolic BP Percentile      Pulse      Resp      Temp      Temp src      SpO2      Weight      Height      Head Circumference      Peak Flow      Pain Score      Pain Loc      Pain Education      Exclude from Growth Chart    No data found.  Updated Vital Signs BP 104/70 (BP Location: Right Arm)   Pulse 95   Temp 99.1 F (37.3 C) (Oral)   Resp 19   SpO2 95%   Visual Acuity Right Eye Distance:   Left Eye Distance:   Bilateral Distance:    Right Eye Near:   Left Eye Near:    Bilateral Near:     Physical Exam Vitals reviewed.  Constitutional:      General: He is not in acute distress.    Appearance: Normal appearance. He is ill-appearing.  HENT:     Head: Normocephalic and atraumatic.     Right Ear: Tympanic membrane, ear canal and external ear normal. No tenderness. No middle ear effusion. There is no impacted cerumen. Tympanic membrane is not perforated, erythematous, retracted or bulging.     Left Ear: Tympanic membrane, ear canal and external ear normal. No tenderness.  No middle ear effusion. There is no impacted cerumen. Tympanic membrane is not perforated, erythematous, retracted or bulging.     Nose: Nose normal. No congestion.     Mouth/Throat:     Mouth: Mucous membranes are moist.     Pharynx: Uvula midline. No oropharyngeal exudate or posterior oropharyngeal erythema.     Tonsils: No tonsillar exudate.  Eyes:     Extraocular Movements: Extraocular movements intact.      Pupils: Pupils are equal, round, and reactive to light.  Cardiovascular:     Rate and Rhythm: Normal rate and regular rhythm.     Heart sounds: Normal heart sounds.  Pulmonary:     Effort: Pulmonary effort is normal.     Breath sounds: Examination of the right-upper field reveals rhonchi. Examination of the left-upper field reveals rhonchi. Rhonchi present. No decreased breath sounds, wheezing or rales.  Abdominal:  Palpations: Abdomen is soft.     Tenderness: There is no abdominal tenderness. There is no guarding or rebound.  Musculoskeletal:     Comments: Calves are equal and symmetric, with no venous distention  Lymphadenopathy:     Cervical: No cervical adenopathy.     Right cervical: No superficial, deep or posterior cervical adenopathy.    Left cervical: No superficial, deep or posterior cervical adenopathy.  Skin:    Comments: No rash   Neurological:     General: No focal deficit present.     Mental Status: He is alert and oriented to person, place, and time.  Psychiatric:        Mood and Affect: Mood normal.        Behavior: Behavior normal.        Thought Content: Thought content normal.        Judgment: Judgment normal.      UC Treatments / Results  Labs (all labs ordered are listed, but only abnormal results are displayed) Labs Reviewed  POCT INFLUENZA A/B - Abnormal; Notable for the following components:      Result Value   Influenza A, POC Positive (*)    All other components within normal limits    EKG   Radiology Sleep Study Documents Result Date: 07/16/2024 Ordered by an unspecified provider.   Procedures Procedures (including critical care time)  Medications Ordered in UC Medications - No data to display  Initial Impression / Assessment and Plan / UC Course  I have reviewed the triage vital signs and the nursing notes.  Pertinent labs & imaging results that were available during my care of the patient were reviewed by me and considered in my  medical decision making (see chart for details).     Patient is a pleasant 74 y.o. male presenting with influenza and atypical chest pain. The patient is afebrile and nontachycardic.  Antipyretic has not been administered today.  Influenza A positive.  EKG with T wave inversion in lead III, when compared 11/2021 EKG.   Patient with chest pain, shortness of breath, calf pain.  On exam, there are expiratory rhonchi.  We do not have chest x-ray available at this urgent care tonight.  Following discussion with patient and his wife, they will proceed to the emergency department for further evaluation.  He is stable for transport in POV driven by wife.  Final Clinical Impressions(s) / UC Diagnoses   Final diagnoses:  Shortness of breath  Atypical chest pain     Discharge Instructions      -I am concerned about your heart and your lungs.  Your EKG is slightly abnormal, which could mean that your heart is struggling for air.  You need cardiac monitoring, and lab work to monitor cardiac enzymes, which I cannot do at urgent care.  I hear congestion in your lungs, and unfortunately I cannot do a chest x-ray at urgent care today. -I highly recommend going to the emergency department for further evaluation.  If your symptoms change or worsen on the way, stop and call 911.     ED Prescriptions   None    PDMP not reviewed this encounter.     [1]  Social History Tobacco Use   Smoking status: Former    Types: Cigarettes   Smokeless tobacco: Former   Tobacco comments:    Started smoking at age 88 and quit in 2019. 04/26/2024.  Vaping Use   Vaping status: Never Used  Substance Use Topics  Alcohol use: Never   Drug use: Never     Arlyss Leita BRAVO, PA-C 07/17/24 1940  "

## 2024-07-17 NOTE — Telephone Encounter (Signed)
 FYI Only or Action Required?: FYI only for provider: ED advised.  Patient was last seen in primary care on 01/05/2024 by Berneta Elsie Sayre, MD.  Called Nurse Triage reporting Chest Pain and Leg Pain.  Symptoms began yesterday.  Interventions attempted: Rest, hydration, or home remedies.  Symptoms are: gradually worsening.  Triage Disposition: Go to ED Now (Notify PCP)  Patient/caregiver understands and will follow disposition?: Yes   Spoke with pts wife Steven Jensen (on [place]), there with pt now. Last night onset of left leg and chest pain. 5/10 right sided CP and right shoulder pain, worse with movement. CP occurs only when coughing. None at rest. No SOB. No confusion. Has hx of prostate cancer, last went through chemo 2 years ago. Has some nausea. Speaking in clear continuous full sentences. No audible SOB or wheezing noted over the phone.  Left calf with 5/10 pain. No redness or swelling. No recent leg injury. Denies cardiac hx or hx of blood clot. Has been laying on back all day while wife was at work. Wife asked pt to sit up when she got home but his whole body started shaking, was having difficulty getting self up without her assistance and was unable to walk. Advised ED, agreeable to go.     CRM # [id number] Owner: None Status: Unresolved Open  Priority: Routine Created on: 07/17/2024 05:03 PM By: Vannie Alfonso HERO   Primary Information  Source  Steven Jensen (Patient)   Subject  Steven Jensen (Patient)   Topic  Clinical - Red Word Triage    Communication  Red Word that prompted transfer to Nurse Triage: pt wife stating pt exp  chest pain, leg pain , unable to move   Reason for Disposition  Unable to walk  Chest pain  Chest pain(s) lasting a few seconds from coughing  Answer Assessment - Initial Assessment Questions 1. LOCATION: Where does it hurt?       Right chest  2. RADIATION: Does the pain go anywhere else? (e.g., into neck, jaw, arms, back)      Right shoulder, worse with movement  3. ONSET: When did the chest pain begin? (Minutes, hours or days)      Last night  4. PATTERN: Does the pain come and go, or has it been constant since it started?  Does it get worse with exertion?      Only when coughing  5. DURATION: How long does it last (e.g., seconds, minutes, hours)     Only while coughing  6. SEVERITY: How bad is the pain?  (e.g., Scale 1-10; mild, moderate, or severe)     5/10 aching  7. CARDIAC RISK FACTORS: Do you have any history of heart problems or risk factors for heart disease? (e.g., angina, prior heart attack; diabetes, high blood pressure, high cholesterol, smoker, or strong family history of heart disease)     Borderline diabetes. Denies rest of above  8. PULMONARY RISK FACTORS: Do you have any history of lung disease?  (e.g., blood clots in lung, asthma, emphysema, birth control pills)     Denies  9. CAUSE: What do you think is causing the chest pain?     Cough  10. OTHER SYMPTOMS: Do you have any other symptoms? (e.g., dizziness, nausea, vomiting, sweating, fever, difficulty breathing, cough)       Cough, nausea  Answer Assessment - Initial Assessment Questions 1. ONSET: When did the pain start?      Last night  2. LOCATION: Where is the pain  located?      Left calf  3. PAIN: How bad is the pain?    (Scale 1-10; or mild, moderate, severe)     5/10  4. WORK OR EXERCISE: Has there been any recent work or exercise that involved this part of the body?      Denies  5. CAUSE: What do you think is causing the leg pain?     *No Answer*  6. OTHER SYMPTOMS: Do you have any other symptoms? (e.g., chest pain, back pain, breathing difficulty, swelling, rash, fever, numbness, weakness)     Right sided CP and nausea  Protocols used: Chest Pain-A-AH, Leg Pain-A-AH

## 2024-07-17 NOTE — Discharge Instructions (Addendum)
-  I am concerned about your heart and your lungs.  Your EKG is slightly abnormal, which could mean that your heart is struggling for air.  You need cardiac monitoring, and lab work to monitor cardiac enzymes, which I cannot do at urgent care.  I hear congestion in your lungs, and unfortunately I cannot do a chest x-ray at urgent care today. -I highly recommend going to the emergency department for further evaluation.  If your symptoms change or worsen on the way, stop and call 911.

## 2024-07-19 NOTE — Telephone Encounter (Signed)
 Called patient,patient is aware of findings has follow up scheduled on Jan 16th will keep tp review in detail.NFN

## 2024-07-25 ENCOUNTER — Other Ambulatory Visit: Payer: Self-pay

## 2024-07-26 ENCOUNTER — Ambulatory Visit: Admitting: Pulmonary Disease

## 2024-07-29 ENCOUNTER — Ambulatory Visit: Admitting: Pulmonary Disease

## 2024-07-31 ENCOUNTER — Ambulatory Visit: Admitting: Pulmonary Disease

## 2024-07-31 ENCOUNTER — Encounter: Payer: Self-pay | Admitting: Pulmonary Disease

## 2024-07-31 VITALS — BP 128/82 | HR 78 | Temp 97.3°F | Ht 69.5 in | Wt 166.6 lb

## 2024-07-31 DIAGNOSIS — Z87891 Personal history of nicotine dependence: Secondary | ICD-10-CM

## 2024-07-31 DIAGNOSIS — G4733 Obstructive sleep apnea (adult) (pediatric): Secondary | ICD-10-CM | POA: Diagnosis not present

## 2024-07-31 DIAGNOSIS — C61 Malignant neoplasm of prostate: Secondary | ICD-10-CM | POA: Diagnosis not present

## 2024-07-31 DIAGNOSIS — G4752 REM sleep behavior disorder: Secondary | ICD-10-CM

## 2024-07-31 DIAGNOSIS — E119 Type 2 diabetes mellitus without complications: Secondary | ICD-10-CM | POA: Diagnosis not present

## 2024-07-31 NOTE — Progress Notes (Signed)
 "              Steven Jensen    [id number]    [birth year]  Primary Care Physician:[name], Elsie Sayre, MD  Referring Physician: Berneta Elsie Sayre, MD [address] [place],  [place] [address]  Chief complaint:   Follow-up for REM behavior disorder  Discussed the use of AI scribe software for clinical note transcription with the patient, who gave verbal consent to proceed.  History of Present Illness Steven Jensen is a 74 year old male who presents with sleep disturbances and loud talking during sleep.  He experiences sleep disturbances characterized by loud talking during sleep. There has been a reduction in fighting during sleep compared to before. He has been taking melatonin, currently at a dose of 5 mg, but the sleep disturbances have resumed.  A sleep study showed episodes of apnea occurring approximately 9.6 times per hour.  He is currently taking several medications, including melatonin, steroids, Nubeqa , Xtandi, and Glucophage . He takes melatonin as part of his nighttime routine, typically around 9:00 or 9:30 PM.  He works every day but does not engage in regular exercise. His job provides some physical activity, but he does not have a structured exercise routine.   Outpatient Encounter Medications as of 07/31/2024  Medication Sig   acetaminophen  (TYLENOL ) 500 MG tablet Take 1,000 mg by mouth every 6 (six) hours as needed for mild pain.   atorvastatin  (LIPITOR) 10 MG tablet Take 1 tablet (10 mg total) by mouth daily.   Calcium  Carbonate (CALCIUM  600 PO) Take 600 mg by mouth daily.   carbamide peroxide (DEBROX) 6.5 % OTIC solution Place 5 drops into both ears 2 (two) times daily.   darolutamide  (NUBEQA ) 300 MG tablet Take 600 mg by mouth 2 (two) times daily with a meal.   ferrous sulfate  324 MG TBEC Take 1 tablet (324 mg total) by mouth daily.   metFORMIN  (GLUCOPHAGE -XR) 500 MG 24 hr tablet TAKE 1 TABLET BY MOUTH NIGHTLY AT BEDTIME   Multiple Vitamin  (MULTIVITAMIN) capsule Take 1 capsule by mouth daily. Multi vitamin with iron   tamsulosin  (FLOMAX ) 0.4 MG CAPS capsule Take 1 capsule (0.4 mg total) by mouth daily.   XTANDI 40 MG tablet Take 80 mg by mouth 2 (two) times daily.   No facility-administered encounter medications on file as of 07/31/2024.    Allergies as of 07/31/2024   (No Known Allergies)    Past Medical History:  Diagnosis Date   Cancer [facility])     Past Surgical History:  Procedure Laterality Date   EYE SURGERY      Family History  Problem Relation Age of Onset   Cancer Mother    Hypertension Mother    Cancer Sister    Cancer Brother    Hypertension Brother     Social History   Socioeconomic History   Marital status: Married    Spouse name: Not on file   Number of children: Not on file   Years of education: Not on file   Highest education level: Not on file  Occupational History   Not on file  Tobacco Use   Smoking status: Former    Types: Cigarettes   Smokeless tobacco: Former   Tobacco comments:    Started smoking at age 80 and quit in 2019. 04/26/2024.  Vaping Use   Vaping status: Never Used  Substance and Sexual Activity   Alcohol use: Never   Drug use: Never   Sexual activity: Yes  Other Topics Concern   Not on file  Social History Narrative   Right handed lives with wife    Social Drivers of Health   Tobacco Use: Medium Risk (07/31/2024)   Patient History    Smoking Tobacco Use: Former    Smokeless Tobacco Use: Former    Passive Exposure: Not on Actuary Strain: Low Risk (02/13/2023)   Overall Financial Resource Strain (CARDIA)    Difficulty of Paying Living Expenses: Not hard at all  Food Insecurity: No Food Insecurity (02/13/2023)   Hunger Vital Sign    Worried About Running Out of Food in the Last Year: Never true    Ran Out of Food in the Last Year: Never true  Transportation Needs: No Transportation Needs (02/13/2023)   PRAPARE - Scientist, Research (physical Sciences) (Medical): No    Lack of Transportation (Non-Medical): No  Physical Activity: Inactive (02/13/2023)   Exercise Vital Sign    Days of Exercise per Week: 0 days    Minutes of Exercise per Session: 0 min  Stress: No Stress Concern Present (02/13/2023)   [employer] of Occupational Health - Occupational Stress Questionnaire    Feeling of Stress : Not at all  Social Connections: Moderately Isolated (02/13/2023)   Social Connection and Isolation Panel    Frequency of Communication with Friends and Family: Once a week    Frequency of Social Gatherings with Friends and Family: Never    Attends Religious Services: More than 4 times per year    Active Member of Clubs or Organizations: No    Attends Banker Meetings: Never    Marital Status: Married  Catering Manager Violence: Not At Risk (02/13/2023)   Humiliation, Afraid, Rape, and Kick questionnaire    Fear of Current or Ex-Partner: No    Emotionally Abused: No    Physically Abused: No    Sexually Abused: No  Depression (PHQ2-9): Low Risk (02/13/2023)   Depression (PHQ2-9)    PHQ-2 Score: 0  Alcohol Screen: Low Risk (02/13/2023)   Alcohol Screen    Last Alcohol Screening Score (AUDIT): 0  Housing: Low Risk (02/13/2023)   Housing    Last Housing Risk Score: 0  Utilities: Not At Risk (02/13/2023)   AHC Utilities    Threatened with loss of utilities: No  Health Literacy: Adequate Health Literacy (02/13/2023)   B1300 Health Literacy    Frequency of need for help with medical instructions: Never    Review of Systems  Respiratory:  Positive for apnea.   Psychiatric/Behavioral:  Positive for sleep disturbance.     Vitals:   07/31/24 0916  BP: 128/82  Pulse: 78  Temp: (!) 97.3 F (36.3 C)  SpO2: 98%     Physical Exam Constitutional:      Appearance: Normal appearance.  HENT:     Head: Normocephalic.     Nose: Nose normal.     Mouth/Throat:     Mouth: Mucous membranes are moist.  Eyes:     General: No  scleral icterus.    Pupils: Pupils are equal, round, and reactive to light.  Cardiovascular:     Rate and Rhythm: Normal rate and regular rhythm.     Heart sounds: No murmur heard.    No friction rub.  Pulmonary:     Effort: No respiratory distress.     Breath sounds: No stridor. No wheezing or rhonchi.  Musculoskeletal:     Cervical back: No rigidity or tenderness.  Skin:    General: Skin is warm.  Neurological:     Mental Status: He is alert.  Psychiatric:        Mood and Affect: Mood normal.    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 Current treatment with melatonin at 5 mg is insufficient. - Increase melatonin dose to 10 mg nightly, taken 30 minutes to an hour before bedtime. - If 10 mg is well-tolerated and events persist, increase to 15 mg. - If grogginess occurs, reduce to 8 mg. - Consider clonazepam if melatonin is ineffective.  Obstructive sleep apnea, mild Mild obstructive sleep apnea with 9.6 apneic episodes per hour. Treatment is recommended due to concurrent REM sleep behavior disorder. - Initiated CPAP therapy to reduce apneic episodes and improve sleep quality. - Coordinated with a medical supply company for CPAP setup and mask fitting. - Will schedule follow-up in 6-8 weeks to assess CPAP efficacy and adjust treatment as needed.  DME referral for auto CPAP 5-15 with heated humidification  Graded exercise as tolerated  Tentative follow-up in about 2 months  Encouraged to call us  with significant concerns  Type 2 diabetes - Controlled  History of prostate cancer stage IV  No orders of the defined types were placed in this encounter.   I personally spent a total of 30 minutes in the care of the patient today including preparing to see the  patient, getting/reviewing separately obtained history, performing a medically appropriate exam/evaluation, counseling and educating, placing orders, documenting clinical information in the EHR, and independently interpreting results.   Jennet Epley MD [facility] 07/31/2024, 9:38 AM  CC: Berneta Elsie Sayre, MD    "

## 2024-07-31 NOTE — Patient Instructions (Signed)
 We will contact the medical supply company to set you up with a CPAP  You can go up on the dose of melatonin from 5 mg to 10 mg nightly Take the pill about 30 minutes or an hour before bedtime -You can increase the dose to 15 mg as long as it is not making you tired with brain fog in the morning, you can also just stay at the 10 mg as long as it is helping  We will set you up with CPAP, it does take a little bit of time to get used to using CPAP on a nightly basis, the expectation is that using CPAP will reduce the number of behavior disorders at night as well  Call us  with significant concerns  Follow-up in about 2 months

## 2024-07-31 NOTE — Progress Notes (Signed)
 Steven Jensen                                          MRN: [id number]   07/31/2024   The [facility] Quality Team Specialist reviewed this patient medical record for the purposes of chart review for care gap closure. The following were reviewed: chart review for care gap closure-colorectal cancer screening and diabetic eye exam.    [facility] Quality Team

## 2024-08-01 ENCOUNTER — Other Ambulatory Visit: Payer: Self-pay

## 2024-08-05 ENCOUNTER — Ambulatory Visit: Payer: Self-pay

## 2024-08-05 NOTE — Telephone Encounter (Signed)
 Pt refused ED x multiple attempts, requesting OV. Please call to advise.  FYI Only or Action Required?: Action required by provider: ED refused, requesting OV.  Patient was last seen in primary care on 01/05/2024 by Berneta Elsie Sayre, MD.  Called Nurse Triage reporting Hematuria and Flank Pain.  Symptoms began several days ago.  Interventions attempted: Nothing.  Symptoms are: gradually worsening.  Triage Disposition: Go to ED Now (Notify PCP)  Patient/caregiver understands and will follow disposition?: No, refuses disposition  Reason for Disposition  Passing pure blood or large blood clots (i.e., size > a dime)  (Exception: Fleck or small strands.)  Answer Assessment - Initial Assessment Questions Pt noticed blood spots in her underwear on 08/02/24 and again today. States initially, he saw spots of blood in his urine. Denies hematuria today. Also reports lower left back pain. Hx of prostate cancer, is under treatment for that.   This RN spoke to patient's wife who stated that she saw more than specks, described as >dime sized.  ED advised and rationales reviewed. Pt refused x multiple attempts. Advised 911 if symptoms worsen or if he experiences difficulty producing urine. Pt verbalized understanding.  1. COLOR of URINE: Describe the color of the urine.  (e.g., tea-colored, pink, red, bloody) Do you have blood clots in your urine? (e.g., none, pea, grape, small coin)     Was blood-tinged, now not 2. ONSET: When did the bleeding start?      08/02/24 3. EPISODES: How many times has there been blood in the urine? or How many times today?     2 4. PAIN with URINATION: Is there any pain with passing your urine? If Yes, ask: How bad is the pain?  (Scale 1-10; or mild, moderate, severe)     Denies 6. ASSOCIATED SYMPTOMS:      Left flank pain  Protocols used: Urine - Blood In-A-AH Message from [place] E sent at 08/05/2024  4:28 PM EST  Summary: Blood in underwear    Reason for Triage: Patient noticed blood in his underwear coming from penis.         Past Medical History:  Diagnosis Date   Cancer (HCC)

## 2024-08-06 ENCOUNTER — Ambulatory Visit: Admitting: Family Medicine

## 2024-08-06 NOTE — Telephone Encounter (Signed)
 I have called and spoken with pt this morning to offer him an appt with one of our other providers as Dr Berneta is out of the office. He reports that he thinks the blood that he saw was actually coming from a small sore near his testicle. He reports that he has cleaned the wound and is keeping it dry. He declines coming in for an appt, but will call back if things get worse and he needs to be seen.

## 2024-08-08 ENCOUNTER — Telehealth: Payer: Self-pay

## 2024-08-08 NOTE — Telephone Encounter (Signed)
 Received CMN from [facility] will fax

## 2024-11-08 ENCOUNTER — Ambulatory Visit: Admitting: Pulmonary Disease
